# Patient Record
Sex: Female | Born: 1960 | Race: White | Hispanic: No | Marital: Married | Smoking: Current every day smoker
Health system: Southern US, Community
[De-identification: ages and names within clinical notes are randomized; demographics above are authoritative.]

## PROBLEM LIST (undated history)

## (undated) DIAGNOSIS — Z87442 Personal history of urinary calculi: Secondary | ICD-10-CM

## (undated) DIAGNOSIS — G4730 Sleep apnea, unspecified: Secondary | ICD-10-CM

## (undated) DIAGNOSIS — F419 Anxiety disorder, unspecified: Secondary | ICD-10-CM

## (undated) DIAGNOSIS — F329 Major depressive disorder, single episode, unspecified: Secondary | ICD-10-CM

## (undated) DIAGNOSIS — M1990 Unspecified osteoarthritis, unspecified site: Secondary | ICD-10-CM

## (undated) DIAGNOSIS — M3500 Sicca syndrome, unspecified: Secondary | ICD-10-CM

## (undated) DIAGNOSIS — I10 Essential (primary) hypertension: Secondary | ICD-10-CM

## (undated) DIAGNOSIS — F32A Depression, unspecified: Secondary | ICD-10-CM

## (undated) DIAGNOSIS — Z9289 Personal history of other medical treatment: Secondary | ICD-10-CM

## (undated) DIAGNOSIS — G8929 Other chronic pain: Secondary | ICD-10-CM

## (undated) HISTORY — PX: CHOLECYSTECTOMY: SHX55

## (undated) HISTORY — PX: ABDOMINAL HYSTERECTOMY: SHX81

---

## 2008-10-14 DIAGNOSIS — Z9289 Personal history of other medical treatment: Secondary | ICD-10-CM

## 2008-10-14 HISTORY — DX: Personal history of other medical treatment: Z92.89

## 2011-11-25 ENCOUNTER — Other Ambulatory Visit: Payer: Self-pay | Admitting: Neurosurgery

## 2011-11-25 ENCOUNTER — Other Ambulatory Visit (HOSPITAL_COMMUNITY): Payer: Self-pay | Admitting: Neurosurgery

## 2011-11-25 DIAGNOSIS — M545 Low back pain: Secondary | ICD-10-CM

## 2011-11-25 DIAGNOSIS — M542 Cervicalgia: Secondary | ICD-10-CM

## 2011-11-27 ENCOUNTER — Other Ambulatory Visit: Payer: Self-pay | Admitting: Neurosurgery

## 2011-12-27 ENCOUNTER — Other Ambulatory Visit (HOSPITAL_COMMUNITY): Payer: Self-pay

## 2012-01-07 ENCOUNTER — Inpatient Hospital Stay (HOSPITAL_COMMUNITY): Admission: RE | Admit: 2012-01-07 | Payer: Self-pay | Source: Ambulatory Visit

## 2012-01-07 ENCOUNTER — Other Ambulatory Visit (HOSPITAL_COMMUNITY): Payer: Self-pay

## 2013-10-14 DIAGNOSIS — G4730 Sleep apnea, unspecified: Secondary | ICD-10-CM

## 2013-10-14 HISTORY — DX: Sleep apnea, unspecified: G47.30

## 2017-06-25 ENCOUNTER — Encounter (HOSPITAL_BASED_OUTPATIENT_CLINIC_OR_DEPARTMENT_OTHER): Payer: Self-pay | Admitting: Emergency Medicine

## 2017-06-25 ENCOUNTER — Emergency Department (HOSPITAL_COMMUNITY): Payer: Medicaid Other

## 2017-06-25 ENCOUNTER — Emergency Department (HOSPITAL_BASED_OUTPATIENT_CLINIC_OR_DEPARTMENT_OTHER)
Admission: EM | Admit: 2017-06-25 | Discharge: 2017-06-26 | Disposition: A | Discharge status: Home / Self Care | Payer: Medicaid Other | Attending: Emergency Medicine | Admitting: Emergency Medicine

## 2017-06-25 DIAGNOSIS — F17200 Nicotine dependence, unspecified, uncomplicated: Secondary | ICD-10-CM | POA: Diagnosis not present

## 2017-06-25 DIAGNOSIS — I10 Essential (primary) hypertension: Secondary | ICD-10-CM | POA: Insufficient documentation

## 2017-06-25 DIAGNOSIS — R29898 Other symptoms and signs involving the musculoskeletal system: Secondary | ICD-10-CM

## 2017-06-25 DIAGNOSIS — R531 Weakness: Secondary | ICD-10-CM | POA: Insufficient documentation

## 2017-06-25 DIAGNOSIS — M542 Cervicalgia: Secondary | ICD-10-CM | POA: Diagnosis not present

## 2017-06-25 DIAGNOSIS — M545 Low back pain: Secondary | ICD-10-CM

## 2017-06-25 DIAGNOSIS — Z79899 Other long term (current) drug therapy: Secondary | ICD-10-CM | POA: Diagnosis not present

## 2017-06-25 HISTORY — DX: Sjogren syndrome, unspecified: M35.00

## 2017-06-25 HISTORY — DX: Essential (primary) hypertension: I10

## 2017-06-25 HISTORY — DX: Other chronic pain: G89.29

## 2017-06-25 HISTORY — DX: Unspecified osteoarthritis, unspecified site: M19.90

## 2017-06-25 LAB — BASIC METABOLIC PANEL
Anion gap: 8 (ref 5–15)
BUN: 11 mg/dL (ref 6–20)
CHLORIDE: 105 mmol/L (ref 101–111)
CO2: 26 mmol/L (ref 22–32)
Calcium: 9.2 mg/dL (ref 8.9–10.3)
Creatinine, Ser: 0.71 mg/dL (ref 0.44–1.00)
GFR calc Af Amer: >60 mL/min (ref >60)
GLUCOSE: 107 mg/dL — AB (ref 65–99)
POTASSIUM: 3.5 mmol/L (ref 3.5–5.1)
Sodium: 139 mmol/L (ref 135–145)

## 2017-06-25 LAB — CBC WITH DIFFERENTIAL/PLATELET
Basophils Absolute: 0.1 10*3/uL (ref 0.0–0.1)
Basophils Relative: 1 %
EOS PCT: 2 %
Eosinophils Absolute: 0.2 10*3/uL (ref 0.0–0.7)
HCT: 41.5 % (ref 36.0–46.0)
Hemoglobin: 14.2 g/dL (ref 12.0–15.0)
LYMPHS ABS: 3.2 10*3/uL (ref 0.7–4.0)
LYMPHS PCT: 28 %
MCH: 29.3 pg (ref 26.0–34.0)
MCHC: 34.2 g/dL (ref 30.0–36.0)
MCV: 85.6 fL (ref 78.0–100.0)
MONO ABS: 0.9 10*3/uL (ref 0.1–1.0)
Monocytes Relative: 7 %
Neutro Abs: 7.4 10*3/uL (ref 1.7–7.7)
Neutrophils Relative %: 62 %
PLATELETS: 387 10*3/uL (ref 150–400)
RBC: 4.85 MIL/uL (ref 3.87–5.11)
RDW: 12.9 % (ref 11.5–15.5)
WBC: 11.7 10*3/uL — ABNORMAL HIGH (ref 4.0–10.5)

## 2017-06-25 LAB — CK: Total CK: 51 U/L (ref 38–234)

## 2017-06-25 MED ORDER — KETOROLAC TROMETHAMINE 30 MG/ML IJ SOLN
15.0000 mg | Freq: Once | INTRAMUSCULAR | Status: AC
  Administered 2017-06-25: 15 mg via INTRAVENOUS
  Filled 2017-06-25: qty 1

## 2017-06-25 MED ORDER — LORAZEPAM 2 MG/ML IJ SOLN
1.0000 mg | Freq: Once | INTRAMUSCULAR | Status: AC
  Administered 2017-06-25: 1 mg via INTRAVENOUS
  Filled 2017-06-25: qty 1

## 2017-06-25 MED ORDER — HYDROMORPHONE HCL 1 MG/ML IJ SOLN
1.0000 mg | Freq: Once | INTRAMUSCULAR | Status: AC
  Administered 2017-06-25: 1 mg via INTRAVENOUS
  Filled 2017-06-25: qty 1

## 2017-06-25 MED ORDER — SODIUM CHLORIDE 0.9 % IV BOLUS (SEPSIS)
1000.0000 mL | Freq: Once | INTRAVENOUS | Status: AC
  Administered 2017-06-25: 1000 mL via INTRAVENOUS

## 2017-06-25 NOTE — ED Notes (Signed)
Spoke to Dr.Kohut who accepted pt; MD to place orders for MRI in waiting room.

## 2017-06-25 NOTE — ED Notes (Signed)
Pt requesting pain medicine, EDP aware 

## 2017-06-25 NOTE — ED Notes (Signed)
Pt transported to MRI 

## 2017-06-25 NOTE — ED Provider Notes (Signed)
[facility]   CSN: [id number] Arrival date & time: 06/25/17  1557     History   Chief Complaint Chief Complaint  Patient presents with  . Neck Pain  . Leg Pain    HPI Doris Padilla is a 56 y.o. female.  HPI  56 year old female with a history of rheumatoid arthritis and Sjogren's disease presents with bilateral thigh pain and weakness. She states this started today. She has a history of chronic pain, mostly in her neck but also her low back. The neck pain is worse. This is been going on for several months she's been trying to get an MRI through both an orthopedist and now a neurosurgeon since May but has been unsuccessful. She has been told she has significant arthritis in her neck and back. Her neck always hurts worse than her back. She occasionally will have arm weakness and numbness, this has been going on for months. However over the last 24 hours or less, she's been having bilateral thigh weakness and pain. Is painful to ambulate. It does not feel like this pain is coming from her back or radiating from her back. It feels like her entire thigh is sore and heavy like lead. No numbness. No bowel or bladder incontinence. No fevers or weight loss. Has taken 2 Advil and her oxycodone without relief.   Past Medical History:  Diagnosis Date  . Arthritis   . Hypertension   . Sjogren's disease (HCC)     There are no active problems to display for this patient.   Past Surgical History:  Procedure Laterality Date  . ABDOMINAL HYSTERECTOMY    . CESAREAN SECTION     x3  . CHOLECYSTECTOMY      OB History    No data available       Home Medications    Prior to Admission medications   Medication Sig Start Date End Date Taking? Authorizing Provider  amLODipine (NORVASC) 10 MG tablet Take 10 mg by mouth daily.   Yes [provider]  clonazePAM (KLONOPIN) 1 MG tablet Take 1 mg by mouth 2 (two) times daily as needed for anxiety.   Yes [provider]  cycloSPORINE (RESTASIS) 0.05 % ophthalmic emulsion Place 1 drop into both eyes 2 (two) times daily.   Yes [provider]  naproxen sodium (ANAPROX) 220 MG tablet Take 220 mg by mouth 2 (two) times daily with a meal.   Yes [provider]  oxyCODONE (OXY IR/ROXICODONE) 5 MG immediate release tablet Take 10 mg by mouth every 4 (four) hours as needed for severe pain.   Yes [provider]  predniSONE (DELTASONE) 5 MG tablet Take 5 mg by mouth daily with breakfast.   Yes [provider]  sertraline (ZOLOFT) 50 MG tablet Take 50 mg by mouth at bedtime.   Yes [provider]    Family History No family history on file.  Social History Social History  Substance Use Topics  . Smoking status: Light Tobacco Smoker  . Smokeless tobacco: Never Used  . Alcohol use No     Allergies   Patient has no known allergies.   Review of Systems Review of Systems  Constitutional: Negative for fever.  Gastrointestinal: Negative for abdominal pain.  Genitourinary: Negative for dysuria.  Musculoskeletal: Positive for back pain, myalgias and neck pain.  Neurological: Positive for weakness. Negative for numbness.  All other systems reviewed and are negative.    Physical Exam Updated Vital Signs  BP 136/83 (BP Location: Right Arm)   Pulse 93   Temp 98 F (36.7 C) (Oral)   Resp 18   Ht 5\' 2"  (1.575 m)   Wt 71.7 kg (158 lb)   SpO2 96%   BMI 28.90 kg/m   Physical Exam  Constitutional: She is oriented to person, place, and time. She appears well-developed and well-nourished. No distress.  HENT:  Head: Normocephalic and atraumatic.  Right Ear: External ear normal.  Left Ear: External ear normal.  Nose: Nose normal.  Eyes: Right eye exhibits no discharge. Left eye exhibits no discharge.  Cardiovascular: Normal rate, regular rhythm and normal heart sounds.   Pulses:      Radial pulses are 2+ on the right side, and 2+ on the left side.         Dorsalis pedis pulses are 2+ on the right side, and 2+ on the left side.  Pulmonary/Chest: Effort normal and breath sounds normal.  Abdominal: Soft. There is no tenderness.  Musculoskeletal:       Right hip: She exhibits normal range of motion and no tenderness.       Left hip: She exhibits normal range of motion and no tenderness.       Cervical back: She exhibits no tenderness.       Thoracic back: She exhibits no tenderness.       Lumbar back: She exhibits no tenderness.       Right upper leg: She exhibits no tenderness and no swelling.       Left upper leg: She exhibits no tenderness and no swelling.  Neurological: She is alert and oriented to person, place, and time.  Reflex Scores:      Patellar reflexes are 2+ on the right side and 2+ on the left side.      Achilles reflexes are 2+ on the right side and 2+ on the left side. Very limited ability to lift legs off bed in straight leg raise bilaterally. Some decreased sensation in thighs compared to lower legs which feel normal. Unclear if there is decreased effort due to pain or true neuro weakness. Is able to walk although slowly and with pain.  Skin: Skin is warm and dry. She is not diaphoretic.  Nursing note and vitals reviewed.    ED Treatments / Results  Labs (all labs ordered are listed, but only abnormal results are displayed) Labs Reviewed  BASIC METABOLIC PANEL - Abnormal; Notable for the following:       Result Value   Glucose, Bld 107 (*)    All other components within normal limits  CBC WITH DIFFERENTIAL/PLATELET - Abnormal; Notable for the following:    WBC 11.7 (*)    All other components within normal limits  CK    EKG  EKG Interpretation None       Radiology No results found.  Procedures Procedures (including critical care time)  Medications Ordered in ED Medications  HYDROmorphone (DILAUDID) injection 1 mg (not administered)  sodium chloride 0.9 % bolus 1,000 mL (1,000 mLs Intravenous New  Bag/Given 06/25/17 1724)  ketorolac (TORADOL) 30 MG/ML injection 15 mg (15 mg Intravenous Given 06/25/17 1724)     Initial Impression / Assessment and Plan / ED Course  I have reviewed the triage vital signs and the nursing notes.  Pertinent labs & imaging results that were available during my care of the patient were reviewed by me and considered in my medical decision making (see chart for details).  Labwork shows no clear etiology for why she would have all of a sudden muscular weakness. There is no focal tenderness in her thighs to suggest needing an x-ray. No trauma. At this point, given the weakness in the proximal lower legs, I think she needs MRI to help rule out acute spinal cord compression. We do not have this available at this facility. She agrees to transfer to the [facility] for MRI. She wants to go by private vehicle with her friend. Givens while striving, she will be given further IV pain control. Discussed with EDP, Dr. Juleen China, who accepts in transfer.  Final Clinical Impressions(s) / ED Diagnoses   Final diagnoses:  Bilateral leg weakness    New Prescriptions New Prescriptions   No medications on file     Pricilla Loveless, MD 06/25/17 1821

## 2017-06-25 NOTE — ED Triage Notes (Signed)
Leg and Neck pain increased last night. Hx of RA and other degenerative issues. Denies recent falls.

## 2017-06-25 NOTE — ED Notes (Signed)
Pt taken to car in [facility] and is on her way to [facility] ED with a friend who is driving her.

## 2017-06-25 NOTE — ED Provider Notes (Addendum)
Notified that arrived to [facility] from [facility] by PV. In waiting. MRI ordered.    Raeford Razor, MD 06/25/17 2044  MRI as below. Pt and SE updated. PRN pain meds. Outpt FU.   Mr Lumbar Spine Wo Contrast  Result Date: 06/26/2017 CLINICAL DATA:  56 y/o F; lower back pain radiating down both thighs. EXAM: MRI LUMBAR SPINE WITHOUT CONTRAST TECHNIQUE: Multiplanar, multisequence MR imaging of the lumbar spine was performed. No intravenous contrast was administered. COMPARISON:  01/26/2015 lumbar spine MRI. FINDINGS: Segmentation:  Standard. Alignment:  Physiologic. Vertebrae:  No fracture, evidence of discitis, or bone lesion. Conus medullaris: Extends to the L1-2 level and appears normal. Stable S2 level Tarlov cysts. Paraspinal and other soft tissues: Negative. Disc levels: L1-2: No significant disc displacement, foraminal stenosis, or canal stenosis. L2-3: No significant disc displacement, foraminal stenosis, or canal stenosis. L3-4: Stable minimal disc bulge. No significant foraminal or canal stenosis. L4-5: Stable small disc bulge eccentric to the left and mild facet hypertrophy. Mild left foraminal stenosis. No significant canal stenosis. L5-S1: Stable slightly left central disc protrusion and mild facet hypertrophy. Stable mild bilateral foraminal and lateral recess stenosis. No significant canal stenosis. IMPRESSION: 1. No acute osseous abnormality. 2. Stable lumbar degenerative changes greatest at the L5-S1 level where a slightly left central disc protrusion and facet hypertrophy results in mild bilateral foraminal and lateral recess stenosis. 3. No significant canal stenosis. Electronically Signed   By: Mitzi Hansen M.D.   On: 06/26/2017 00:33      Raeford Razor, MD 06/26/17 [phone]

## 2017-06-26 MED ORDER — OXYCODONE-ACETAMINOPHEN 5-325 MG PO TABS: 1.0000 | ORAL_TABLET | ORAL | 0 refills | Status: DC | PRN

## 2017-06-26 MED ORDER — DIAZEPAM 5 MG PO TABS: 5.0000 mg | ORAL_TABLET | Freq: Three times a day (TID) | ORAL | 0 refills | Status: DC | PRN

## 2017-07-21 ENCOUNTER — Encounter (HOSPITAL_BASED_OUTPATIENT_CLINIC_OR_DEPARTMENT_OTHER): Payer: Self-pay | Admitting: *Deleted

## 2017-07-21 ENCOUNTER — Emergency Department (HOSPITAL_BASED_OUTPATIENT_CLINIC_OR_DEPARTMENT_OTHER): Payer: Medicaid Other

## 2017-07-21 ENCOUNTER — Emergency Department (HOSPITAL_BASED_OUTPATIENT_CLINIC_OR_DEPARTMENT_OTHER)
Admission: EM | Admit: 2017-07-21 | Discharge: 2017-07-21 | Disposition: A | Discharge status: Home / Self Care | Payer: Medicaid Other | Attending: Emergency Medicine | Admitting: Emergency Medicine

## 2017-07-21 DIAGNOSIS — Z79899 Other long term (current) drug therapy: Secondary | ICD-10-CM | POA: Insufficient documentation

## 2017-07-21 DIAGNOSIS — G43809 Other migraine, not intractable, without status migrainosus: Secondary | ICD-10-CM | POA: Diagnosis not present

## 2017-07-21 DIAGNOSIS — R197 Diarrhea, unspecified: Secondary | ICD-10-CM | POA: Insufficient documentation

## 2017-07-21 DIAGNOSIS — F17200 Nicotine dependence, unspecified, uncomplicated: Secondary | ICD-10-CM | POA: Insufficient documentation

## 2017-07-21 DIAGNOSIS — R51 Headache: Secondary | ICD-10-CM | POA: Diagnosis present

## 2017-07-21 DIAGNOSIS — I10 Essential (primary) hypertension: Secondary | ICD-10-CM | POA: Insufficient documentation

## 2017-07-21 LAB — CBC
HCT: 43.2 % (ref 36.0–46.0)
Hemoglobin: 14.8 g/dL (ref 12.0–15.0)
MCH: 28.9 pg (ref 26.0–34.0)
MCHC: 34.3 g/dL (ref 30.0–36.0)
MCV: 84.4 fL (ref 78.0–100.0)
PLATELETS: 438 10*3/uL — AB (ref 150–400)
RBC: 5.12 MIL/uL — ABNORMAL HIGH (ref 3.87–5.11)
RDW: 12.9 % (ref 11.5–15.5)
WBC: 12.3 10*3/uL — AB (ref 4.0–10.5)

## 2017-07-21 LAB — URINALYSIS, ROUTINE W REFLEX MICROSCOPIC
Bilirubin Urine: NEGATIVE
Glucose, UA: NEGATIVE mg/dL
Hgb urine dipstick: NEGATIVE
Ketones, ur: NEGATIVE mg/dL
Leukocytes, UA: NEGATIVE
Nitrite: NEGATIVE
Protein, ur: NEGATIVE mg/dL
Specific Gravity, Urine: <1.005 — ABNORMAL LOW (ref 1.005–1.030)
pH: 6.5 (ref 5.0–8.0)

## 2017-07-21 LAB — BASIC METABOLIC PANEL WITH GFR
Anion gap: 7 (ref 5–15)
BUN: 8 mg/dL (ref 6–20)
CO2: 24 mmol/L (ref 22–32)
Calcium: 10 mg/dL (ref 8.9–10.3)
Chloride: 106 mmol/L (ref 101–111)
Creatinine, Ser: 0.61 mg/dL (ref 0.44–1.00)
GFR calc Af Amer: >60 mL/min
GFR calc non Af Amer: >60 mL/min
Glucose, Bld: 110 mg/dL — ABNORMAL HIGH (ref 65–99)
Potassium: 3.3 mmol/L — ABNORMAL LOW (ref 3.5–5.1)
Sodium: 137 mmol/L (ref 135–145)

## 2017-07-21 MED ORDER — DIPHENHYDRAMINE HCL 50 MG/ML IJ SOLN
12.5000 mg | Freq: Once | INTRAMUSCULAR | Status: AC
  Administered 2017-07-21: 12.5 mg via INTRAVENOUS
  Filled 2017-07-21: qty 1

## 2017-07-21 MED ORDER — PROCHLORPERAZINE EDISYLATE 5 MG/ML IJ SOLN
10.0000 mg | Freq: Once | INTRAMUSCULAR | Status: AC
  Administered 2017-07-21: 10 mg via INTRAVENOUS
  Filled 2017-07-21: qty 2

## 2017-07-21 MED ORDER — DEXAMETHASONE SODIUM PHOSPHATE 10 MG/ML IJ SOLN
10.0000 mg | Freq: Once | INTRAMUSCULAR | Status: AC
  Administered 2017-07-21: 10 mg via INTRAVENOUS
  Filled 2017-07-21: qty 1

## 2017-07-21 MED ORDER — SODIUM CHLORIDE 0.9 % IV BOLUS (SEPSIS)
1000.0000 mL | Freq: Once | INTRAVENOUS | Status: AC
  Administered 2017-07-21: 1000 mL via INTRAVENOUS

## 2017-07-21 NOTE — ED Notes (Signed)
Patient transported to CT 

## 2017-07-21 NOTE — Discharge Instructions (Signed)
Please read attached information regarding your condition. Continue home medications as previously prescribed. Follow-up with your PCP and spine specialist for further evaluation. Take Tylenol as needed for headache. Return to ED for severe or worsening headache, vision changes, falls, head injuries, vomiting.

## 2017-07-21 NOTE — ED Provider Notes (Signed)
[facility]   CSN: [id number] Arrival date & time: 07/21/17  1301     History   Chief Complaint Chief Complaint  Patient presents with  . Hypertension  . Headache    HPI Doris Padilla is a 56 y.o. female, with a past medical history of Sjogren's disease, arthritis, hypertension, presents to ED for evaluation of headache for the past day. She also reports high blood pressure when she checked it this morning. She did take 2 doses of her home amlodipine. She has been having a bilateral parietal headache since yesterday. States that the pain radiates to her years. She has not tried any medications to help with headache. She also reports associated photophobia and phonophobia. She denies any previous history of similar symptoms. She's also been experiencing intermittent nausea and several episodes of diarrhea. She was taking care of her mother who had similar symptoms last week. She denies any vision changes, head injuries, falls, abdominal pain, urinary symptoms, fever, neck pain, rashes, chest pain, shortness of breath.  HPI  Past Medical History:  Diagnosis Date  . Arthritis    RA  . Chronic pain   . Hypertension   . Sjogren's disease (HCC)     There are no active problems to display for this patient.   Past Surgical History:  Procedure Laterality Date  . ABDOMINAL HYSTERECTOMY    . CESAREAN SECTION     x3  . CHOLECYSTECTOMY      OB History    No data available       Home Medications    Prior to Admission medications   Medication Sig Start Date End Date Taking? Authorizing Provider  amLODipine (NORVASC) 10 MG tablet Take 10 mg by mouth daily.    [provider]  clonazePAM (KLONOPIN) 1 MG tablet Take 1 mg by mouth 2 (two) times daily as needed for anxiety.    [provider]  cycloSPORINE (RESTASIS) 0.05 % ophthalmic emulsion Place 1 drop into both eyes 2 (two) times daily.    [provider]  diazepam (VALIUM)  5 MG tablet Take 1 tablet (5 mg total) by mouth every 8 (eight) hours as needed for anxiety. 06/26/17   Raeford Razor, MD  naproxen sodium (ANAPROX) 220 MG tablet Take 220 mg by mouth 2 (two) times daily with a meal.    [provider]  oxyCODONE (OXY IR/ROXICODONE) 5 MG immediate release tablet Take 10 mg by mouth every 4 (four) hours as needed for severe pain.    [provider]  oxyCODONE-acetaminophen (PERCOCET/ROXICET) 5-325 MG tablet Take 1-2 tablets by mouth every 4 (four) hours as needed for severe pain. 06/26/17   Raeford Razor, MD  predniSONE (DELTASONE) 5 MG tablet Take 5 mg by mouth daily with breakfast.    [provider]  sertraline (ZOLOFT) 50 MG tablet Take 50 mg by mouth at bedtime.    [provider]    Family History No family history on file.  Social History Social History  Substance Use Topics  . Smoking status: Light Tobacco Smoker  . Smokeless tobacco: Never Used  . Alcohol use No     Allergies   Patient has no known allergies.   Review of Systems Review of Systems  Constitutional: Negative for appetite change, chills and fever.  HENT: Negative for ear pain, rhinorrhea, sneezing and sore throat.   Eyes: Positive for photophobia. Negative for visual disturbance.  Respiratory: Negative for cough, chest tightness, shortness of breath and  wheezing.   Cardiovascular: Negative for chest pain and palpitations.  Gastrointestinal: Positive for diarrhea and nausea. Negative for abdominal pain, blood in stool, constipation and vomiting.  Genitourinary: Negative for dysuria, hematuria and urgency.  Musculoskeletal: Positive for myalgias.  Skin: Negative for rash.  Neurological: Positive for light-headedness and headaches. Negative for dizziness and weakness.     Physical Exam Updated Vital Signs BP (!) 136/100   Pulse 95   Temp 98.8 F (37.1 C) (Oral)   Resp 10   Ht 5\' 2"  (1.575 m)   Wt 71.7 kg (158 lb)   SpO2 98%   BMI  28.90 kg/m   Physical Exam  Constitutional: She is oriented to person, place, and time. She appears well-developed and well-nourished. No distress.  Nontoxic appearing and in no acute distress. Patient does appear uncomfortable and has lights off in room.  HENT:  Head: Normocephalic and atraumatic.  Nose: Nose normal.  Eyes: Pupils are equal, round, and reactive to light. Conjunctivae and EOM are normal. Right eye exhibits no discharge. Left eye exhibits no discharge. No scleral icterus.  Neck: Normal range of motion. Neck supple.  Cardiovascular: Normal rate, regular rhythm, normal heart sounds and intact distal pulses.  Exam reveals no gallop and no friction rub.   No murmur heard. Pulmonary/Chest: Effort normal and breath sounds normal. No respiratory distress.  Abdominal: Soft. Bowel sounds are normal. She exhibits no distension. There is no tenderness. There is no guarding.  Musculoskeletal: Normal range of motion. She exhibits no edema.  Neurological: She is alert and oriented to person, place, and time. No cranial nerve deficit or sensory deficit. She exhibits normal muscle tone. Coordination normal.  Pupils reactive. No facial asymmetry noted. Cranial nerves appear grossly intact. Sensation intact to light touch on face, BUE and BLE. Strength 5/5 in BUE and BLE. Normal finger to nose coordination bilaterally.  Skin: Skin is warm and dry. No rash noted. She is not diaphoretic.  No temporal rash or tenderness to palpation of the temporal area.  Psychiatric: She has a normal mood and affect.  Nursing note and vitals reviewed.    ED Treatments / Results  Labs (all labs ordered are listed, but only abnormal results are displayed) Labs Reviewed  BASIC METABOLIC PANEL - Abnormal; Notable for the following:       Result Value   Potassium 3.3 (*)    Glucose, Bld 110 (*)    All other components within normal limits  CBC - Abnormal; Notable for the following:    WBC 12.3 (*)    RBC  5.12 (*)    Platelets 438 (*)    All other components within normal limits  URINALYSIS, ROUTINE W REFLEX MICROSCOPIC - Abnormal; Notable for the following:    Specific Gravity, Urine <1.005 (*)    All other components within normal limits    EKG  EKG Interpretation  Date/Time:  Monday July 21 2017 14:07:13 EDT Ventricular Rate:  97 PR Interval:    QRS Duration: 89 QT Interval:  353 QTC Calculation: 449 R Axis:   75 Text Interpretation:  Sinus rhythm since last tracing no significant change Confirmed by Rolan Bucco [phone]) on 07/21/2017 2:16:37 PM       Radiology Ct Head Wo Contrast  Result Date: 07/21/2017 CLINICAL DATA:  Headache and hypertension. EXAM: CT HEAD WITHOUT CONTRAST TECHNIQUE: Contiguous axial images were obtained from the base of the skull through the vertex without intravenous contrast. COMPARISON:  None. FINDINGS: Brain: No evidence of  acute infarction, hemorrhage, hydrocephalus, extra-axial collection or mass lesion/mass effect. Vascular: Mild calcific atherosclerosis at the skullbase. Skull: Normal. Negative for fracture or focal lesion. Sinuses/Orbits: No acute finding. Other: None. IMPRESSION: No acute intracranial abnormality. 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 Electronically Signed   By: Ted Mcalpine M.D.   On: 07/21/2017 15:40    Procedures Procedures (including critical care time)  Medications Ordered in ED Medications  dexamethasone (DECADRON) injection 10 mg (not administered)  sodium chloride 0.9 % bolus 1,000 mL (1,000 mLs Intravenous New Bag/Given 07/21/17 1442)  prochlorperazine (COMPAZINE) injection 10 mg (10 mg Intravenous Given 07/21/17 1442)  diphenhydrAMINE (BENADRYL) injection 12.5 mg (12.5 mg Intravenous Given 07/21/17 1442)     Initial Impression / Assessment and Plan / ED Course  I have reviewed the triage vital signs and the nursing notes.  Pertinent labs &  imaging results that were available during my care of the patient were reviewed by me and considered in my medical decision making (see chart for details).     Patient presents to ED for evaluation of headache since yesterday. She has associated photophobia and phonophobia. She does have a history of hypertension and states that her blood pressure was elevated this morning. She took 2 doses of her usual amlodipine dose. She has not tried any medications to help with headache. She denies any injuries, falls, numbness in legs, vision changes. She's been expressing intermittent nausea and several episodes of diarrhea after being exposed to sick contacts with similar symptoms. On physical exam patient is nontoxic-appearing and in no acute distress. She has no deficits on neurological exam. Her cranial nerves appear grossly intact. She does not appear dehydrated but does appear uncomfortable. There are no signs of head injury, temporal tenderness to palpation or rashes. She is afebrile with no history of fever. Lab work including BMP, urinalysis unremarkable. CBC shows mild leukocytosis at 12.3. Head CT returned as negative for acute abnormality. I suspect that her leukocytosis is reactive to the symptoms she is having. Patient given fluids, Compazine and Benadryl here in the ED which much improvement in her symptoms. Patient given Decadron for further management and prevention of rebound headache. Blood pressure improved here in the ED. I suspect that her symptoms are due to a migraine type headache rather than acute intracranial abnormality or infectious cause or temporal arteritis.  Advised to follow-up with her PCP in any specialist for further evaluation. Patient appears stable for discharge at this time. Strict return precautions given.  Final Clinical Impressions(s) / ED Diagnoses   Final diagnoses:  Other migraine without status migrainosus, not intractable    New Prescriptions New Prescriptions    No medications on file     Dietrich Pates, PA-C 07/21/17 1637    Rolan Bucco, MD 07/25/17 [phone]

## 2017-07-21 NOTE — ED Triage Notes (Signed)
Headache and elevated BP at home this am. States she had an MRI of her neck 3 weeks ago but does not have the results.

## 2017-07-28 ENCOUNTER — Other Ambulatory Visit: Payer: Self-pay | Admitting: Neurosurgery

## 2017-08-14 NOTE — Pre-Procedure Instructions (Signed)
Doris Padilla  08/14/2017      [id number] - [place], [place] - [address] [place] [address] Phone: [phone] Fax: [phone]    Your procedure is scheduled on Nov 7  Report to [facility] Admitting at 1000 A.M.  Call this number if you have problems the morning of surgery:  [phone]   Remember:  Do not eat food or drink liquids after midnight.  Take these medicines the morning of surgery with A SIP OF WATER Amlodipine (Norvasc), Clonazepam (Klonopin) if needed, Restasis eye drops, Fluoxetine (Prozac), Flonase nasal spray if needed, orphenadrine (Norflex), Oxycodone HCL if needed  Stop taking aspirin as directed by your Dr.  Stop taking BC's, Goody's, Herbal medications, Fish Oil, Ibuprofen, Advil, Motrin, Aleve, Vitamins    Do not wear jewelry, make-up or nail polish.  Do not wear lotions, powders, or perfumes, or deoderant.  Do not shave 48 hours prior to surgery.  Men may shave face and neck.  Do not bring valuables to the hospital.  [facility] is not responsible for any belongings or valuables.  Contacts, dentures or bridgework may not be worn into surgery.  Leave your suitcase in the car.  After surgery it may be brought to your room.  For patients admitted to the hospital, discharge time will be determined by your treatment team.  Patients discharged the day of surgery will not be allowed to drive home.   Special instructions:  [name] - Preparing for Surgery  Before surgery, you can play an important role.  Because skin is not sterile, your skin needs to be as free of germs as possible.  You can reduce the number of germs on you skin by washing with CHG (chlorahexidine gluconate) soap before surgery.  CHG is an antiseptic cleaner which kills germs and bonds with the skin to continue killing germs even after washing.  Please DO NOT use if you have an allergy to CHG or antibacterial soaps.  If your skin becomes  reddened/irritated stop using the CHG and inform your nurse when you arrive at Short Stay.  Do not shave (including legs and underarms) for at least 48 hours prior to the first CHG shower.  You may shave your face.  Please follow these instructions carefully:   1.  Shower with CHG Soap the night before surgery and the   morning of Surgery.  2.  If you choose to wash your hair, wash your hair first as usual with your   normal shampoo.  3.  After you shampoo, rinse your hair and body thoroughly to remove the  Shampoo.  4.  Use CHG as you would any other liquid soap.  You can apply chg directly to the skin and wash gently with scrungie or a clean washcloth.  5.  Apply the CHG Soap to your body ONLY FROM THE NECK DOWN.  Do not use on open wounds or open sores.  Avoid contact with your eyes,  ears, mouth and genitals (private parts).  Wash genitals (private parts)       with your normal soap.  6.  Wash thoroughly, paying special attention to the area where your surgery   will be performed.  7.  Thoroughly rinse your body with warm water from the neck down.  8.  DO NOT shower/wash with your normal soap after using and rinsing off   the CHG Soap.  9.  Pat yourself dry with a clean  towel.            10.  Wear clean pajamas.            11.  Place clean sheets on your bed the night of your first shower and do not sleep with pets.  Day of Surgery  Do not apply any lotions/deoderants the morning of surgery.  Please wear clean clothes to the hospital/surgery center.     Please read over the following fact sheets that you were given. Pain Booklet, Coughing and Deep Breathing, MRSA Information and Surgical Site Infection Prevention

## 2017-08-15 ENCOUNTER — Encounter (HOSPITAL_COMMUNITY): Payer: Self-pay

## 2017-08-15 ENCOUNTER — Encounter (HOSPITAL_COMMUNITY)
Admission: RE | Admit: 2017-08-15 | Discharge: 2017-08-15 | Disposition: A | Discharge status: Home / Self Care | Payer: Medicaid Other | Source: Ambulatory Visit | Attending: Neurosurgery | Admitting: Neurosurgery

## 2017-08-15 DIAGNOSIS — Z01818 Encounter for other preprocedural examination: Secondary | ICD-10-CM | POA: Diagnosis not present

## 2017-08-15 DIAGNOSIS — M4722 Other spondylosis with radiculopathy, cervical region: Secondary | ICD-10-CM | POA: Diagnosis not present

## 2017-08-15 HISTORY — DX: Personal history of urinary calculi: Z87.442

## 2017-08-15 HISTORY — DX: Anxiety disorder, unspecified: F41.9

## 2017-08-15 HISTORY — DX: Major depressive disorder, single episode, unspecified: F32.9

## 2017-08-15 HISTORY — DX: Personal history of other medical treatment: Z92.89

## 2017-08-15 HISTORY — DX: Sleep apnea, unspecified: G47.30

## 2017-08-15 HISTORY — DX: Depression, unspecified: F32.A

## 2017-08-15 LAB — CBC
HEMATOCRIT: 43.5 % (ref 36.0–46.0)
Hemoglobin: 15.2 g/dL — ABNORMAL HIGH (ref 12.0–15.0)
MCH: 30 pg (ref 26.0–34.0)
MCHC: 34.9 g/dL (ref 30.0–36.0)
MCV: 85.8 fL (ref 78.0–100.0)
PLATELETS: 438 10*3/uL — AB (ref 150–400)
RBC: 5.07 MIL/uL (ref 3.87–5.11)
RDW: 13 % (ref 11.5–15.5)
WBC: 11.4 10*3/uL — AB (ref 4.0–10.5)

## 2017-08-15 LAB — TYPE AND SCREEN
ABO/RH(D): O POS
Antibody Screen: NEGATIVE

## 2017-08-15 LAB — BASIC METABOLIC PANEL
Anion gap: 11 (ref 5–15)
BUN: 6 mg/dL (ref 6–20)
CHLORIDE: 103 mmol/L (ref 101–111)
CO2: 23 mmol/L (ref 22–32)
CREATININE: 0.65 mg/dL (ref 0.44–1.00)
Calcium: 9.9 mg/dL (ref 8.9–10.3)
GFR calc Af Amer: >60 mL/min (ref >60)
GFR calc non Af Amer: >60 mL/min (ref >60)
Glucose, Bld: 96 mg/dL (ref 65–99)
POTASSIUM: 3.6 mmol/L (ref 3.5–5.1)
Sodium: 137 mmol/L (ref 135–145)

## 2017-08-15 LAB — ABO/RH: ABO/RH(D): O POS

## 2017-08-15 LAB — SURGICAL PCR SCREEN
MRSA, PCR: NEGATIVE
Staphylococcus aureus: NEGATIVE

## 2017-08-15 NOTE — Progress Notes (Signed)
Pt. Not feeling well today, saw PCP- Whiten in [place] & she was given 3 day course of Zpak for swollen glands. On 08/12/2017 , she finished it & doesn't feel improved, has not called MD to f/u either. Pt. Denies chest concerns, denies fever, cough, etc., presenting with general malaise. Pt. Admits to stressful personal issues, has a a 56 y.o son on dialysis. She lives  With her daughter who has a 10 mth. Old, she also reports that she is separated from her husband but he will accompany her DOS.  Pt. Goes to a Pain med. Mgt office in [place].  Pt reports a stress test was done in [facility] hosp. Approx. 2-3 yrs. Ago, told that it was wnl.  Requested that record from [facility].

## 2017-08-18 NOTE — Progress Notes (Signed)
Anesthesia Chart Review:  Pt is a 56 year old female scheduled for C5-6, C6-7 ACDF on 08/20/2017 with Tressie Stalker, MD  - PCP is Selina Cooley, MD (notes in care everywhere) who last saw pt for URI 08/07/17 and prescribed zithromax 500mg  x 3 days 08/11/17.  Pt reported at pre-admission testing 08/15/17 she has finished zithromax but does not yet feel better. Denied fever, cough; c/o malaise. I notified Nikki in Dr. 13/2/18 office of pt reporting she does not feel well.  Pt was instructed by PAT RN to report this to PCP.   PMH includes:  HTN, OSA, Sjogren's disease. Current smoker. BMI 29  Medications include: Amlodipine, ASA 81 mg  BP 134/81   Pulse 76   Temp 36.6 C (Oral)   Resp 18   Ht 5\' 1"  (1.549 m)   Wt 154 lb 6 oz (70 kg)   SpO2 96%   BMI 29.17 kg/m   Preoperative labs reviewed.    EKG 07/21/17: sinus rhythm  Pt reports she had a stress test ~3 years ago that was normal at [facility].  Unable to locate records.   If no signs/sx of acute illness day of surgery, I anticipate pt can proceed as scheduled.   09/20/17, FNP-BC [facility]/Anesthesiology Phone: [phone] 08/18/2017 12:04 PM

## 2017-08-20 ENCOUNTER — Other Ambulatory Visit: Payer: Self-pay

## 2017-08-20 ENCOUNTER — Ambulatory Visit (HOSPITAL_COMMUNITY): Payer: Medicaid Other | Admitting: Emergency Medicine

## 2017-08-20 ENCOUNTER — Encounter (HOSPITAL_COMMUNITY): Payer: Self-pay | Admitting: *Deleted

## 2017-08-20 ENCOUNTER — Encounter (HOSPITAL_COMMUNITY)
Admission: RE | Disposition: A | Discharge status: Home / Self Care | Payer: Self-pay | Source: Ambulatory Visit | Attending: Neurosurgery

## 2017-08-20 ENCOUNTER — Ambulatory Visit (HOSPITAL_COMMUNITY): Payer: Medicaid Other

## 2017-08-20 ENCOUNTER — Ambulatory Visit (HOSPITAL_COMMUNITY): Payer: Medicaid Other | Admitting: Anesthesiology

## 2017-08-20 ENCOUNTER — Ambulatory Visit (HOSPITAL_COMMUNITY)
Admission: RE | Admit: 2017-08-20 | Discharge: 2017-08-21 | Disposition: A | Discharge status: Home / Self Care | Payer: Medicaid Other | Source: Ambulatory Visit | Attending: Neurosurgery | Admitting: Neurosurgery

## 2017-08-20 DIAGNOSIS — G8929 Other chronic pain: Secondary | ICD-10-CM | POA: Diagnosis not present

## 2017-08-20 DIAGNOSIS — Z888 Allergy status to other drugs, medicaments and biological substances status: Secondary | ICD-10-CM | POA: Diagnosis not present

## 2017-08-20 DIAGNOSIS — F17210 Nicotine dependence, cigarettes, uncomplicated: Secondary | ICD-10-CM | POA: Diagnosis not present

## 2017-08-20 DIAGNOSIS — Z79899 Other long term (current) drug therapy: Secondary | ICD-10-CM | POA: Diagnosis not present

## 2017-08-20 DIAGNOSIS — Z79891 Long term (current) use of opiate analgesic: Secondary | ICD-10-CM | POA: Insufficient documentation

## 2017-08-20 DIAGNOSIS — M50122 Cervical disc disorder at C5-C6 level with radiculopathy: Secondary | ICD-10-CM | POA: Diagnosis not present

## 2017-08-20 DIAGNOSIS — F329 Major depressive disorder, single episode, unspecified: Secondary | ICD-10-CM | POA: Diagnosis not present

## 2017-08-20 DIAGNOSIS — Z7982 Long term (current) use of aspirin: Secondary | ICD-10-CM | POA: Diagnosis not present

## 2017-08-20 DIAGNOSIS — I10 Essential (primary) hypertension: Secondary | ICD-10-CM | POA: Insufficient documentation

## 2017-08-20 DIAGNOSIS — M50123 Cervical disc disorder at C6-C7 level with radiculopathy: Secondary | ICD-10-CM | POA: Diagnosis not present

## 2017-08-20 DIAGNOSIS — M4802 Spinal stenosis, cervical region: Secondary | ICD-10-CM | POA: Insufficient documentation

## 2017-08-20 DIAGNOSIS — Z791 Long term (current) use of non-steroidal anti-inflammatories (NSAID): Secondary | ICD-10-CM | POA: Insufficient documentation

## 2017-08-20 DIAGNOSIS — M4722 Other spondylosis with radiculopathy, cervical region: Secondary | ICD-10-CM | POA: Diagnosis not present

## 2017-08-20 DIAGNOSIS — F419 Anxiety disorder, unspecified: Secondary | ICD-10-CM | POA: Diagnosis not present

## 2017-08-20 DIAGNOSIS — M069 Rheumatoid arthritis, unspecified: Secondary | ICD-10-CM | POA: Diagnosis not present

## 2017-08-20 DIAGNOSIS — G4730 Sleep apnea, unspecified: Secondary | ICD-10-CM | POA: Diagnosis not present

## 2017-08-20 DIAGNOSIS — Z419 Encounter for procedure for purposes other than remedying health state, unspecified: Secondary | ICD-10-CM

## 2017-08-20 HISTORY — PX: ANTERIOR CERVICAL DECOMP/DISCECTOMY FUSION: SHX1161

## 2017-08-20 SURGERY — ANTERIOR CERVICAL DECOMPRESSION/DISCECTOMY FUSION 2 LEVELS
Anesthesia: General

## 2017-08-20 MED ORDER — HEMOSTATIC AGENTS (NO CHARGE) OPTIME
TOPICAL | Status: DC | PRN
  Administered 2017-08-20: 1 via TOPICAL

## 2017-08-20 MED ORDER — SUGAMMADEX SODIUM 200 MG/2ML IV SOLN
INTRAVENOUS | Status: AC
  Filled 2017-08-20: qty 2

## 2017-08-20 MED ORDER — CEFAZOLIN SODIUM-DEXTROSE 2-4 GM/100ML-% IV SOLN
2.0000 g | INTRAVENOUS | Status: AC
  Administered 2017-08-20: 2 g via INTRAVENOUS
  Filled 2017-08-20: qty 100

## 2017-08-20 MED ORDER — FLUTICASONE PROPIONATE 50 MCG/ACT NA SUSP
1.0000 | Freq: Every day | NASAL | Status: DC
  Filled 2017-08-20: qty 16

## 2017-08-20 MED ORDER — 0.9 % SODIUM CHLORIDE (POUR BTL) OPTIME
TOPICAL | Status: DC | PRN
  Administered 2017-08-20: 1000 mL

## 2017-08-20 MED ORDER — SUGAMMADEX SODIUM 200 MG/2ML IV SOLN
INTRAVENOUS | Status: DC | PRN
Start: 2017-08-20 — End: 2017-08-20
  Administered 2017-08-20: 140 mg via INTRAVENOUS

## 2017-08-20 MED ORDER — ONDANSETRON HCL 4 MG PO TABS: 4.0000 mg | ORAL_TABLET | Freq: Four times a day (QID) | ORAL | Status: DC | PRN

## 2017-08-20 MED ORDER — METHOCARBAMOL 500 MG PO TABS
500.0000 mg | ORAL_TABLET | Freq: Four times a day (QID) | ORAL | Status: DC | PRN
  Administered 2017-08-20: 500 mg via ORAL

## 2017-08-20 MED ORDER — FENTANYL CITRATE (PF) 100 MCG/2ML IJ SOLN
INTRAMUSCULAR | Status: DC | PRN
  Administered 2017-08-20 (×2): 25 ug via INTRAVENOUS
  Administered 2017-08-20: 50 ug via INTRAVENOUS
  Administered 2017-08-20: 100 ug via INTRAVENOUS
  Administered 2017-08-20: 50 ug via INTRAVENOUS

## 2017-08-20 MED ORDER — DEXAMETHASONE SODIUM PHOSPHATE 10 MG/ML IJ SOLN
INTRAMUSCULAR | Status: DC | PRN
  Administered 2017-08-20: 10 mg via INTRAVENOUS

## 2017-08-20 MED ORDER — HYDROMORPHONE HCL 1 MG/ML IJ SOLN
INTRAMUSCULAR | Status: AC
  Filled 2017-08-20: qty 1

## 2017-08-20 MED ORDER — DIPHENHYDRAMINE HCL 50 MG/ML IJ SOLN
INTRAMUSCULAR | Status: DC | PRN
  Administered 2017-08-20: 12.5 mg via INTRAVENOUS

## 2017-08-20 MED ORDER — PROPOFOL 10 MG/ML IV BOLUS
INTRAVENOUS | Status: DC | PRN
  Administered 2017-08-20: 140 mg via INTRAVENOUS
  Administered 2017-08-20: 20 mg via INTRAVENOUS

## 2017-08-20 MED ORDER — ARTIFICIAL TEARS OPHTHALMIC OINT
TOPICAL_OINTMENT | OPHTHALMIC | Status: DC | PRN
  Administered 2017-08-20: 1 via OPHTHALMIC

## 2017-08-20 MED ORDER — ROCURONIUM BROMIDE 10 MG/ML (PF) SYRINGE
PREFILLED_SYRINGE | INTRAVENOUS | Status: AC
  Filled 2017-08-20: qty 5

## 2017-08-20 MED ORDER — DIPHENHYDRAMINE HCL 50 MG/ML IJ SOLN
INTRAMUSCULAR | Status: AC
  Filled 2017-08-20: qty 1

## 2017-08-20 MED ORDER — MIDAZOLAM HCL 2 MG/2ML IJ SOLN
INTRAMUSCULAR | Status: AC
  Filled 2017-08-20: qty 2

## 2017-08-20 MED ORDER — ACETAMINOPHEN 325 MG PO TABS
650.0000 mg | ORAL_TABLET | ORAL | Status: DC | PRN
  Administered 2017-08-20 – 2017-08-21 (×2): 650 mg via ORAL
  Filled 2017-08-20 (×2): qty 2

## 2017-08-20 MED ORDER — HYDROMORPHONE HCL 1 MG/ML IJ SOLN
0.2500 mg | INTRAMUSCULAR | Status: DC | PRN
  Administered 2017-08-20 (×4): 0.5 mg via INTRAVENOUS

## 2017-08-20 MED ORDER — OXYCODONE HCL 5 MG PO TABS: 5.0000 mg | ORAL_TABLET | Freq: Once | ORAL | Status: DC | PRN

## 2017-08-20 MED ORDER — ADULT MULTIVITAMIN W/MINERALS CH: 1.0000 | ORAL_TABLET | Freq: Every day | ORAL | Status: DC

## 2017-08-20 MED ORDER — BACITRACIN ZINC 500 UNIT/GM EX OINT
TOPICAL_OINTMENT | CUTANEOUS | Status: DC | PRN
  Administered 2017-08-20: 1 via TOPICAL

## 2017-08-20 MED ORDER — BISACODYL 10 MG RE SUPP: 10.0000 mg | Freq: Every day | RECTAL | Status: DC | PRN

## 2017-08-20 MED ORDER — THROMBIN (RECOMBINANT) 5000 UNITS EX SOLR
CUTANEOUS | Status: DC | PRN
  Administered 2017-08-20: 5000 [IU] via TOPICAL

## 2017-08-20 MED ORDER — CHLORHEXIDINE GLUCONATE CLOTH 2 % EX PADS: 6.0000 | MEDICATED_PAD | Freq: Once | CUTANEOUS | Status: DC

## 2017-08-20 MED ORDER — OXYCODONE HCL 5 MG/5ML PO SOLN: 5.0000 mg | Freq: Once | ORAL | Status: DC | PRN

## 2017-08-20 MED ORDER — DEXAMETHASONE SODIUM PHOSPHATE 4 MG/ML IJ SOLN: 4.0000 mg | Freq: Four times a day (QID) | INTRAMUSCULAR | Status: DC

## 2017-08-20 MED ORDER — ROCURONIUM BROMIDE 100 MG/10ML IV SOLN
INTRAVENOUS | Status: DC | PRN
  Administered 2017-08-20: 10 mg via INTRAVENOUS
  Administered 2017-08-20: 50 mg via INTRAVENOUS
  Administered 2017-08-20: 10 mg via INTRAVENOUS
  Administered 2017-08-20: 30 mg via INTRAVENOUS

## 2017-08-20 MED ORDER — DEXAMETHASONE 4 MG PO TABS
4.0000 mg | ORAL_TABLET | Freq: Four times a day (QID) | ORAL | Status: DC
  Administered 2017-08-20 – 2017-08-21 (×2): 4 mg via ORAL
  Filled 2017-08-20 (×2): qty 1

## 2017-08-20 MED ORDER — HYDROMORPHONE HCL 1 MG/ML IJ SOLN: 0.2500 mg | INTRAMUSCULAR | Status: DC | PRN

## 2017-08-20 MED ORDER — ONDANSETRON HCL 4 MG/2ML IJ SOLN
INTRAMUSCULAR | Status: AC
  Filled 2017-08-20: qty 2

## 2017-08-20 MED ORDER — MENTHOL 3 MG MT LOZG: 1.0000 | LOZENGE | OROMUCOSAL | Status: DC | PRN

## 2017-08-20 MED ORDER — MIDAZOLAM HCL 5 MG/5ML IJ SOLN
INTRAMUSCULAR | Status: DC | PRN
  Administered 2017-08-20: 2 mg via INTRAVENOUS

## 2017-08-20 MED ORDER — THROMBIN (RECOMBINANT) 5000 UNITS EX SOLR
CUTANEOUS | Status: AC
  Filled 2017-08-20: qty 5000

## 2017-08-20 MED ORDER — BUPIVACAINE-EPINEPHRINE (PF) 0.5% -1:200000 IJ SOLN
INTRAMUSCULAR | Status: DC | PRN
  Administered 2017-08-20: 10 mL via PERINEURAL

## 2017-08-20 MED ORDER — SUCCINYLCHOLINE CHLORIDE 200 MG/10ML IV SOSY
PREFILLED_SYRINGE | INTRAVENOUS | Status: AC
  Filled 2017-08-20: qty 10

## 2017-08-20 MED ORDER — ALUM & MAG HYDROXIDE-SIMETH 200-200-20 MG/5ML PO SUSP: 30.0000 mL | Freq: Four times a day (QID) | ORAL | Status: DC | PRN

## 2017-08-20 MED ORDER — ONDANSETRON HCL 4 MG/2ML IJ SOLN: 4.0000 mg | Freq: Four times a day (QID) | INTRAMUSCULAR | Status: DC | PRN

## 2017-08-20 MED ORDER — ONDANSETRON HCL 4 MG/2ML IJ SOLN
INTRAMUSCULAR | Status: DC | PRN
  Administered 2017-08-20: 4 mg via INTRAVENOUS

## 2017-08-20 MED ORDER — CEFAZOLIN SODIUM-DEXTROSE 2-4 GM/100ML-% IV SOLN
2.0000 g | Freq: Three times a day (TID) | INTRAVENOUS | Status: AC
  Administered 2017-08-20 – 2017-08-21 (×2): 2 g via INTRAVENOUS
  Filled 2017-08-20 (×2): qty 100

## 2017-08-20 MED ORDER — LACTATED RINGERS IV SOLN
INTRAVENOUS | Status: DC
  Administered 2017-08-20 (×3): via INTRAVENOUS

## 2017-08-20 MED ORDER — LIDOCAINE HCL (CARDIAC) 20 MG/ML IV SOLN
INTRAVENOUS | Status: DC | PRN
  Administered 2017-08-20: 80 mg via INTRAVENOUS

## 2017-08-20 MED ORDER — OXYCODONE HCL 5 MG PO TABS: 5.0000 mg | ORAL_TABLET | ORAL | Status: DC | PRN

## 2017-08-20 MED ORDER — ACETAMINOPHEN 650 MG RE SUPP: 650.0000 mg | RECTAL | Status: DC | PRN

## 2017-08-20 MED ORDER — DEXAMETHASONE SODIUM PHOSPHATE 10 MG/ML IJ SOLN
INTRAMUSCULAR | Status: AC
  Filled 2017-08-20: qty 1

## 2017-08-20 MED ORDER — PHENOL 1.4 % MT LIQD: 1.0000 | OROMUCOSAL | Status: DC | PRN

## 2017-08-20 MED ORDER — BACITRACIN 50000 UNITS IM SOLR
INTRAMUSCULAR | Status: DC | PRN
  Administered 2017-08-20: 12:00:00

## 2017-08-20 MED ORDER — FENTANYL CITRATE (PF) 250 MCG/5ML IJ SOLN
INTRAMUSCULAR | Status: AC
  Filled 2017-08-20: qty 5

## 2017-08-20 MED ORDER — OXYCODONE HCL 5 MG PO TABS
ORAL_TABLET | ORAL | Status: AC
  Filled 2017-08-20: qty 2

## 2017-08-20 MED ORDER — BACITRACIN ZINC 500 UNIT/GM EX OINT
TOPICAL_OINTMENT | CUTANEOUS | Status: AC
  Filled 2017-08-20: qty 28.35

## 2017-08-20 MED ORDER — ZOLPIDEM TARTRATE 5 MG PO TABS: 5.0000 mg | ORAL_TABLET | Freq: Every evening | ORAL | Status: DC | PRN

## 2017-08-20 MED ORDER — CYCLOSPORINE 0.05 % OP EMUL
1.0000 [drp] | Freq: Two times a day (BID) | OPHTHALMIC | Status: DC
  Administered 2017-08-20: 1 [drp] via OPHTHALMIC
  Filled 2017-08-20 (×2): qty 1

## 2017-08-20 MED ORDER — AMLODIPINE BESYLATE 10 MG PO TABS
10.0000 mg | ORAL_TABLET | Freq: Every day | ORAL | Status: DC
  Filled 2017-08-20: qty 1

## 2017-08-20 MED ORDER — LACTATED RINGERS IV SOLN: INTRAVENOUS | Status: DC

## 2017-08-20 MED ORDER — OXYCODONE HCL 5 MG PO TABS
10.0000 mg | ORAL_TABLET | ORAL | Status: DC | PRN
  Administered 2017-08-20 – 2017-08-21 (×5): 10 mg via ORAL
  Filled 2017-08-20 (×5): qty 2

## 2017-08-20 MED ORDER — FLUOXETINE HCL 20 MG PO CAPS: 40.0000 mg | ORAL_CAPSULE | Freq: Every day | ORAL | Status: DC

## 2017-08-20 MED ORDER — PHENYLEPHRINE HCL 10 MG/ML IJ SOLN
INTRAVENOUS | Status: DC | PRN
  Administered 2017-08-20: 10 ug/min via INTRAVENOUS

## 2017-08-20 MED ORDER — BUPIVACAINE-EPINEPHRINE (PF) 0.5% -1:200000 IJ SOLN
INTRAMUSCULAR | Status: AC
  Filled 2017-08-20: qty 30

## 2017-08-20 MED ORDER — MORPHINE SULFATE (PF) 4 MG/ML IV SOLN
4.0000 mg | INTRAVENOUS | Status: DC | PRN
  Administered 2017-08-20 – 2017-08-21 (×2): 4 mg via INTRAVENOUS
  Filled 2017-08-20 (×2): qty 1

## 2017-08-20 MED ORDER — DOCUSATE SODIUM 100 MG PO CAPS
100.0000 mg | ORAL_CAPSULE | Freq: Two times a day (BID) | ORAL | Status: DC
  Administered 2017-08-20: 100 mg via ORAL
  Filled 2017-08-20: qty 1

## 2017-08-20 MED ORDER — PROPOFOL 10 MG/ML IV BOLUS
INTRAVENOUS | Status: AC
  Filled 2017-08-20: qty 20

## 2017-08-20 SURGICAL SUPPLY — 56 items
BAG DECANTER FOR FLEXI CONT (MISCELLANEOUS) ×2 IMPLANT
BENZOIN TINCTURE PRP APPL 2/3 (GAUZE/BANDAGES/DRESSINGS) ×2 IMPLANT
BIT DRILL NEURO 2X3.1 SFT TUCH (MISCELLANEOUS) ×1 IMPLANT
BLADE SURG 15 STRL LF DISP TIS (BLADE) ×1 IMPLANT
BLADE SURG 15 STRL SS (BLADE) ×1
BLADE ULTRA TIP 2M (BLADE) ×2 IMPLANT
BUR BARREL STRAIGHT FLUTE 4.0 (BURR) ×2 IMPLANT
BUR MATCHSTICK NEURO 3.0 LAGG (BURR) ×2 IMPLANT
CANISTER SUCT 3000ML PPV (MISCELLANEOUS) ×2 IMPLANT
CARTRIDGE OIL MAESTRO DRILL (MISCELLANEOUS) ×1 IMPLANT
COVER MAYO STAND STRL (DRAPES) ×2 IMPLANT
DERMABOND ADVANCED (GAUZE/BANDAGES/DRESSINGS) ×1
DERMABOND ADVANCED .7 DNX12 (GAUZE/BANDAGES/DRESSINGS) ×1 IMPLANT
DIFFUSER DRILL AIR PNEUMATIC (MISCELLANEOUS) ×2 IMPLANT
DRAPE LAPAROTOMY 100X72 PEDS (DRAPES) ×2 IMPLANT
DRAPE MICROSCOPE LEICA (MISCELLANEOUS) ×2 IMPLANT
DRAPE POUCH INSTRU U-SHP 10X18 (DRAPES) ×2 IMPLANT
DRAPE SURG 17X23 STRL (DRAPES) ×4 IMPLANT
DRILL NEURO 2X3.1 SOFT TOUCH (MISCELLANEOUS) ×2
ELECT REM PT RETURN 9FT ADLT (ELECTROSURGICAL) ×2
ELECTRODE REM PT RTRN 9FT ADLT (ELECTROSURGICAL) ×1 IMPLANT
GAUZE SPONGE 4X4 12PLY STRL (GAUZE/BANDAGES/DRESSINGS) ×2 IMPLANT
GAUZE SPONGE 4X4 16PLY XRAY LF (GAUZE/BANDAGES/DRESSINGS) IMPLANT
GLOVE BIO SURGEON STRL SZ8 (GLOVE) ×2 IMPLANT
GLOVE BIO SURGEON STRL SZ8.5 (GLOVE) ×2 IMPLANT
GLOVE EXAM NITRILE LRG STRL (GLOVE) IMPLANT
GLOVE EXAM NITRILE XL STR (GLOVE) IMPLANT
GLOVE EXAM NITRILE XS STR PU (GLOVE) IMPLANT
GOWN STRL REUS W/ TWL LRG LVL3 (GOWN DISPOSABLE) ×2 IMPLANT
GOWN STRL REUS W/ TWL XL LVL3 (GOWN DISPOSABLE) ×1 IMPLANT
GOWN STRL REUS W/TWL LRG LVL3 (GOWN DISPOSABLE) ×2
GOWN STRL REUS W/TWL XL LVL3 (GOWN DISPOSABLE) ×1
HEMOSTAT POWDER KIT SURGIFOAM (HEMOSTASIS) ×2 IMPLANT
KIT BASIN OR (CUSTOM PROCEDURE TRAY) ×2 IMPLANT
KIT ROOM TURNOVER OR (KITS) ×2 IMPLANT
MARKER SKIN DUAL TIP RULER LAB (MISCELLANEOUS) ×2 IMPLANT
NEEDLE HYPO 22GX1.5 SAFETY (NEEDLE) ×2 IMPLANT
NEEDLE SPNL 18GX3.5 QUINCKE PK (NEEDLE) ×2 IMPLANT
NS IRRIG 1000ML POUR BTL (IV SOLUTION) ×2 IMPLANT
OIL CARTRIDGE MAESTRO DRILL (MISCELLANEOUS) ×2
PACK LAMINECTOMY NEURO (CUSTOM PROCEDURE TRAY) ×2 IMPLANT
PEEK S VISTA 7X11X14 (Peek) ×4 IMPLANT
PIN DISTRACTION 14MM (PIN) ×4 IMPLANT
PLATE ANT CERV XTEND 2 LV 30 (Plate) ×2 IMPLANT
PUTTY KINEX BIOACTIVE 5CC (Bone Implant) ×2 IMPLANT
RUBBERBAND STERILE (MISCELLANEOUS) ×4 IMPLANT
SCREW XTD VAR 4.2 SELF TAP 12 (Screw) ×12 IMPLANT
SPONGE INTESTINAL PEANUT (DISPOSABLE) ×4 IMPLANT
SPONGE SURGIFOAM ABS GEL SZ50 (HEMOSTASIS) IMPLANT
STRIP CLOSURE SKIN 1/2X4 (GAUZE/BANDAGES/DRESSINGS) ×2 IMPLANT
SUT VIC AB 0 CT1 27 (SUTURE) ×1
SUT VIC AB 0 CT1 27XBRD ANTBC (SUTURE) ×1 IMPLANT
SUT VIC AB 3-0 SH 8-18 (SUTURE) ×2 IMPLANT
TOWEL GREEN STERILE (TOWEL DISPOSABLE) ×2 IMPLANT
TOWEL GREEN STERILE FF (TOWEL DISPOSABLE) ×2 IMPLANT
WATER STERILE IRR 1000ML POUR (IV SOLUTION) ×2 IMPLANT

## 2017-08-20 NOTE — Op Note (Signed)
Brief history: The patient is a 56 year old white female who has complained of neck and arm pain consistent with a cervical radiculopathy. She has failed medical management and was worked up with a cervical MRI. 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 I discussed the various treatment with patient including surgery. She has weighed the risks, benefits, and alternatives to surgery and decided proceed with a C5-6 and C6-7 anterior cervical discectomy, fusion and plating.  Preoperative diagnosis: C5-6 and C6-7 disc degeneration, spondylosis, stenosis, cervicalgia, cervical radiculopathy  Postoperative diagnosis: The same  Procedure: C5-6 and C6-7 Anterior cervical discectomy/decompression; C5-6 and C6-7 interbody arthrodesis with local morcellized autograft bone and Kinnex bone graft extender; insertion of interbody prosthesis at C5-6 and C6-7 (Zimmer peek interbody prosthesis); anterior cervical plating from C5-C7 with globus titanium plate  Surgeon: Dr. Delma Officer  Asst.: Dr. Mikal Plane  Anesthesia: Gen. endotracheal  Estimated blood loss: 100 mL  Drains: None  Complications: None  Description of procedure: The patient was brought to the operating room by the anesthesia team. General endotracheal anesthesia was induced. A roll was placed under the patient's shoulders to keep the neck in the neutral position. The patient's anterior cervical region was then prepared with Betadine scrub and Betadine solution. Sterile drapes were applied.  The area to be incised was then injected with Marcaine with epinephrine solution. I then used a scalpel to make a transverse incision in the patient's left anterior neck. I used the Metzenbaum scissors to divide the platysmal muscle and then to dissect medial to the sternocleidomastoid muscle, jugular vein, and carotid artery. I carefully dissected down towards the anterior cervical spine identifying the esophagus and retracting it  medially. Then using Kitner swabs to clear soft tissue from the anterior cervical spine. We then inserted a bent spinal needle into the upper exposed intervertebral disc space. We then obtained intraoperative radiographs confirm our location.  I then used electrocautery to detach the medial border of the longus colli muscle bilaterally from the C5-6 and C6-7 intervertebral disc spaces. I then inserted the Caspar self-retaining retractor underneath the longus colli muscle bilaterally to provide exposure.  We then incised the intervertebral disc at C5-6. We then performed a partial intervertebral discectomy with a pituitary forceps and the Karlin curettes. I then inserted distraction screws into the vertebral bodies at C5-6. We then distracted the interspace. We then used the high-speed drill to decorticate the vertebral endplates at C5-6, to drill away the remainder of the intervertebral disc, to drill away some posterior spondylosis, and to thin out the posterior longitudinal ligament. I then incised ligament with the arachnoid knife. We then removed the ligament with a Kerrison punches undercutting the vertebral endplates and decompressing the thecal sac. We then performed foraminotomies about the bilateral C6 nerve roots. This completed the decompression at this level.  We then repeated this procedure in an analogous fashion and C6-7 decompressing the thecal sac and the bilateral C7 nerve roots.  We now turned our to attention to the interbody fusion. We used the trial spacers to determine the appropriate size for the interbody prosthesis. We then pre-filled prosthesis with a combination of local morcellized autograft bone that we obtained during decompression as well as Kinnex bone graft extender. We then inserted the prosthesis into the distracted interspace at C5-6 and C6-7. We then removed the distraction screws. There was a good snug fit of the prosthesis in the interspace.  Having completed the  fusion we now turned attention to the  anterior spinal instrumentation. We used the high-speed drill to drill away some anterior spondylosis at the disc spaces so that the plate lay down flat. We selected the appropriate length titanium anterior cervical plate. We laid it along the anterior aspect of the vertebral bodies from C5-C7. We then drilled 12 mm holes at C5, C6 and C7. We then secured the plate to the vertebral bodies by placing two 12 mm self-tapping screws at C5, C6 and C7. We then obtained intraoperative radiograph. The demonstrating good position of the instrumentation. We therefore secured the screws the plate the locking each cam. This completed the instrumentation.  We then obtained hemostasis using bipolar electrocautery. We irrigated the wound out with bacitracin solution. We then removed the retractor. We inspected the esophagus for any damage. There was none apparent. We then reapproximated patient's platysmal muscle with interrupted 3-0 Vicryl suture. We then reapproximated the subcutaneous tissue with interrupted 3-0 Vicryl suture. The skin was reapproximated with Steri-Strips and benzoin. The wound was then covered with bacitracin ointment. A sterile dressing was applied. The drapes were removed. Patient was subsequently extubated by the anesthesia team and transported to the post anesthesia care unit in stable condition. All sponge instrument and needle counts were reportedly correct at the end of this case.

## 2017-08-20 NOTE — Progress Notes (Signed)
Patient ID: Doris Padilla, female   DOB: [birth year], 56 y.o.   MRN: [id number] Subjective:  The patient is alert and pleasant. She is in no apparent distress.  Objective: Vital signs in last 24 hours: Temp:  [98.5 F (36.9 C)-99.1 F (37.3 C)] 99.1 F (37.3 C) (11/07 1450) Pulse Rate:  [84-125] 115 (11/07 1550) Resp:  [11-20] 13 (11/07 1550) BP: (127-149)/(77-88) 127/88 (11/07 1550) SpO2:  [94 %-97 %] 95 % (11/07 1550) Weight:  [70 kg (154 lb 6 oz)] 70 kg (154 lb 6 oz) (11/07 1018)  Intake/Output from previous day: No intake/output data recorded. Intake/Output this shift: Total I/O In: 1400 [I.V.:1400] Out: 100 [Blood:100]  Physical exam the patient is alert and pleasant. She is moving all 4 extremities well.  The patient's dressing is clean and dry. There is no hematoma or shift.  Lab Results: No results for input(s): WBC, HGB, HCT, PLT in the last 72 hours. BMET No results for input(s): NA, K, CL, CO2, GLUCOSE, BUN, CREATININE, CALCIUM in the last 72 hours.  Studies/Results: Dg Cervical Spine 2-3 Views  Result Date: 08/20/2017 CLINICAL DATA:  Initial evaluation for ACDF of C5-6 and C6-7. EXAM: CERVICAL SPINE - 2-3 VIEW COMPARISON:  Prior MRI from 07/10/2017. FINDINGS: Two lateral views of the cervical spine are provided. First image demonstrates localization hardware at the anterior aspect of the C5-6 interspace. Second image demonstrates placement of ACDF at C5 through C7. No complication. Alignment is physiologic and within normal limits. IMPRESSION: Intraoperative views for ACDF at C5-6 and C6-7. Electronically Signed   By: Rise Mu M.D.   On: 08/20/2017 14:44    Assessment/Plan: The patient is doing well. I have spoken with her husband. She is likely going to stay overnight.  LOS: 0 days     Jorrell Kuster D 08/20/2017, 3:55 PM

## 2017-08-20 NOTE — Anesthesia Preprocedure Evaluation (Addendum)
Anesthesia Evaluation  Patient identified by MRN, date of birth, ID band Patient awake    Reviewed: Allergy & Precautions, NPO status , Patient's Chart, lab work & pertinent test results  History of Anesthesia Complications Negative for: history of anesthetic complications  Airway Mallampati: II  TM Distance: >3 FB Neck ROM: Full    Dental  (+) Teeth Intact   Pulmonary sleep apnea , Current Smoker,    breath sounds clear to auscultation       Cardiovascular hypertension,  Rhythm:Regular Rate:Normal     Neuro/Psych    GI/Hepatic negative GI ROS, Neg liver ROS,   Endo/Other  negative endocrine ROS  Renal/GU negative Renal ROS     Musculoskeletal  (+) Arthritis ,   Abdominal   Peds  Hematology negative hematology ROS (+)   Anesthesia Other Findings   Reproductive/Obstetrics                            Anesthesia Physical Anesthesia Plan  ASA: II  Anesthesia Plan: General   Post-op Pain Management:    Induction: Intravenous  PONV Risk Score and Plan: 3 and Treatment may vary due to age or medical condition, Dexamethasone and Ondansetron  Airway Management Planned: Oral ETT  Additional Equipment:   Intra-op Plan:   Post-operative Plan: Extubation in OR  Informed Consent: I have reviewed the patients History and Physical, chart, labs and discussed the procedure including the risks, benefits and alternatives for the proposed anesthesia with the patient or authorized representative who has indicated his/her understanding and acceptance.   Dental advisory given  Plan Discussed with: CRNA  Anesthesia Plan Comments:        Anesthesia Quick Evaluation

## 2017-08-20 NOTE — Transfer of Care (Signed)
Immediate Anesthesia Transfer of Care Note  Patient: Doris Padilla  Procedure(s) Performed: ANTERIOR CERVICAL DECOMPRESSION/DISCECTOMY FUSION, INTERBODY PROSTHESIS, PLATE/SCREWS CERVICAL FIVE- CERVICAL SIX , CERVICAL SIX - CERVICAL SEVEN (N/A )  Patient Location: PACU  Anesthesia Type:General  Level of Consciousness: awake, alert  and oriented  Airway & Oxygen Therapy: Patient connected to face mask oxygen  Post-op Assessment: Post -op Vital signs reviewed and stable  Post vital signs: stable  Last Vitals:  Vitals:   08/20/17 1018 08/20/17 1448  BP: (!) 149/80   Pulse: 84 (!) 119  Resp: 18   Temp: 36.9 C   SpO2: 96%     Last Pain:  Vitals:   08/20/17 1018  TempSrc: Oral      Patients Stated Pain Goal: 2 (08/20/17 1051)  Complications: No apparent anesthesia complications

## 2017-08-20 NOTE — Anesthesia Procedure Notes (Signed)
Procedure Name: Intubation Date/Time: 08/20/2017 12:03 PM Performed by: Lavell Luster, CRNA Pre-anesthesia Checklist: Patient identified, Emergency Drugs available, Suction available, Timeout performed and Patient being monitored Patient Re-evaluated:Patient Re-evaluated prior to induction Oxygen Delivery Method: Circle system utilized Preoxygenation: Pre-oxygenation with 100% oxygen Induction Type: IV induction Ventilation: Mask ventilation without difficulty Laryngoscope Size: Mac and 3 Grade View: Grade II Tube type: Oral Tube size: 7.0 mm Number of attempts: 1 Airway Equipment and Method: Stylet Placement Confirmation: ETT inserted through vocal cords under direct vision,  positive ETCO2 and breath sounds checked- equal and bilateral Secured at: 22 cm Tube secured with: Tape Dental Injury: Teeth and Oropharynx as per pre-operative assessment

## 2017-08-20 NOTE — H&P (Signed)
Subjective: The patient is a 56 year old white female who has complained of neck and arm pain consistent with a cervical radiculopathy. She has failed medical management and was worked up with a cervical MRI. This demonstrated disc degeneration, spondylosis, stenosis, etc. At C5-6 and C6-7. We discussed the various treatment options including surgery. She has weighed the risks, benefits, and alternatives to surgery and decided to proceed with a C5-6 and C6-7 anterior cervical discectomy, fusion, and plating.   Past Medical History:  Diagnosis Date  . Anxiety   . Arthritis    RA  . Chronic pain   . Depression   . History of blood transfusion 2010  . History of kidney stones    followed by cystocopy  . Hypertension   . Sjogren's disease (HCC)   . Sleep apnea 2015   sleep study produced need for CPAP, insurance lapped, CPAP returned  but pt. want help in getting it back    Past Surgical History:  Procedure Laterality Date  . ABDOMINAL HYSTERECTOMY    . CESAREAN SECTION     x3  . CHOLECYSTECTOMY      Allergies  Allergen Reactions  . Other Hives  . Sudafed Pe [Phenylephrine] Hives    Social History   Tobacco Use  . Smoking status: Current Every Day Smoker    Packs/day: 0.50    Types: Cigarettes  . Smokeless tobacco: Never Used  Substance Use Topics  . Alcohol use: No    No family history on file. Prior to Admission medications   Medication Sig Start Date End Date Taking? Authorizing Provider  amLODipine (NORVASC) 10 MG tablet Take 10 mg by mouth daily.   Yes [provider]  aspirin EC 81 MG tablet Take 81 mg by mouth daily.   Yes [provider]  azithromycin (ZITHROMAX) 500 MG tablet Take 500 mg by mouth daily. 3 day course started 08-12-17   Yes [provider]  clonazePAM (KLONOPIN) 1 MG tablet Take 1 mg by mouth daily as needed for anxiety.    Yes [provider]  cycloSPORINE (RESTASIS) 0.05 % ophthalmic emulsion Place 1 drop into  both eyes 2 (two) times daily.   Yes [provider]  FLUoxetine (PROZAC) 40 MG capsule Take 40 mg by mouth daily before breakfast.    Yes [provider]  fluticasone (FLONASE) 50 MCG/ACT nasal spray SPRAY 1 SPRAY INTO EACH NOSTRIL EVERY DAYAS NEEDED FOR ALLERGIES 05/13/17  Yes [provider]  ibuprofen (ADVIL,MOTRIN) 200 MG tablet Take 400 mg by mouth every 8 (eight) hours as needed for headache.   Yes [provider]  Multiple Vitamin (MULTIVITAMIN WITH MINERALS) TABS tablet Take 1 tablet by mouth daily.   Yes [provider]  orphenadrine (NORFLEX) 100 MG tablet TAKE 1 TABLET BY MOUTH 2 TIMES DAILY FOR 10 DAYS. 07/22/17  Yes [provider]  Oxycodone HCl 10 MG TABS TAKE 1 TABLET 3 TIMES A DAY AS NEEDED FOR PAIN 07/24/17  Yes [provider]  diazepam (VALIUM) 5 MG tablet Take 1 tablet (5 mg total) by mouth every 8 (eight) hours as needed for anxiety. Patient not taking: Reported on 08/13/2017 06/26/17   Raeford Razor, MD  oxyCODONE-acetaminophen (PERCOCET/ROXICET) 5-325 MG tablet Take 1-2 tablets by mouth every 4 (four) hours as needed for severe pain. Patient not taking: Reported on 08/13/2017 06/26/17   Raeford Razor, MD     Review of Systems  Positive ROS: as above  All other systems have been reviewed  and were otherwise negative with the exception of those mentioned in the HPI and as above.  Objective: Vital signs in last 24 hours: Temp:  [98.5 F (36.9 C)] 98.5 F (36.9 C) (11/07 1018) Pulse Rate:  [84] 84 (11/07 1018) Resp:  [18] 18 (11/07 1018) BP: (149)/(80) 149/80 (11/07 1018) SpO2:  [96 %] 96 % (11/07 1018) Weight:  [70 kg (154 lb 6 oz)] 70 kg (154 lb 6 oz) (11/07 1018)  General Appearance: Alert Head: Normocephalic, without obvious abnormality, atraumatic Eyes: PERRL, conjunctiva/corneas clear, EOM's intact,    Ears: Normal  Throat: Normal  Neck: Supple,positive Spurling's testing Back:  unremarkable Lungs: Clear to auscultation bilaterally, respirations unlabored Heart: Regular rate and rhythm, no murmur, rub or gallop Abdomen: Soft, non-tender Extremities: Extremities normal, atraumatic, no cyanosis or edema Skin: unremarkable  NEUROLOGIC:   Mental status: alert and oriented,Motor Exam - grossly normal Sensory Exam - grossly normal Reflexes:  Coordination - grossly normal Gait - grossly normal Balance - grossly normal Cranial Nerves: I: smell Not tested  II: visual acuity  OS: Normal  OD: Normal   II: visual fields Full to confrontation  II: pupils Equal, round, reactive to light  III,VII: ptosis None  III,IV,VI: extraocular muscles  Full ROM  V: mastication Normal  V: facial light touch sensation  Normal  V,VII: corneal reflex  Present  VII: facial muscle function - upper  Normal  VII: facial muscle function - lower Normal  VIII: hearing Not tested  IX: soft palate elevation  Normal  IX,X: gag reflex Present  XI: trapezius strength  5/5  XI: sternocleidomastoid strength 5/5  XI: neck flexion strength  5/5  XII: tongue strength  Normal    Data Review Lab Results  Component Value Date   WBC 11.4 (H) 08/15/2017   HGB 15.2 (H) 08/15/2017   HCT 43.5 08/15/2017   MCV 85.8 08/15/2017   PLT 438 (H) 08/15/2017   Lab Results  Component Value Date   NA 137 08/15/2017   K 3.6 08/15/2017   CL 103 08/15/2017   CO2 23 08/15/2017   BUN 6 08/15/2017   CREATININE 0.65 08/15/2017   GLUCOSE 96 08/15/2017   No results found for: INR, PROTIME  Assessment/Plan: C5-6 and C6-7 disc degeneration, spondylosis, stenosis, cervicalgia, cervical radiculopathy: I have discussed the situation with the patient and reviewed her imaging studies with her. We have discussed the various treatment options including surgery. I described the surgical treatment option of the C5-6 and C6-7 anterior cervical discectomy, fusion, and plating. I have shown her surgical models. I have  given her surgical pamphlet. We have discussed the risks, benefits, alternatives, expected postoperative course, and likelihood of achieving her goals with surgery. I have answered all her questions. She has decided to proceed with surgery.   Jarrod Bodkins D 08/20/2017 11:55 AM

## 2017-08-21 ENCOUNTER — Encounter (HOSPITAL_COMMUNITY): Payer: Self-pay | Admitting: Neurosurgery

## 2017-08-21 DIAGNOSIS — M50122 Cervical disc disorder at C5-C6 level with radiculopathy: Secondary | ICD-10-CM | POA: Diagnosis not present

## 2017-08-21 MED ORDER — DOCUSATE SODIUM 100 MG PO CAPS: 100.0000 mg | ORAL_CAPSULE | Freq: Two times a day (BID) | ORAL | 0 refills | Status: AC

## 2017-08-21 MED ORDER — OXYCODONE HCL 10 MG PO TABS: 10.0000 mg | ORAL_TABLET | ORAL | 0 refills | Status: AC | PRN

## 2017-08-21 NOTE — Care Management Note (Signed)
Case Management Note  Patient Details  Name: Doris Padilla MRN: [id number] Date of Birth: Nov 03, 1960  Subjective/Objective:                 Patient with order to DC to home today. Chart reviewed. No Home Health or Equipment needs, no unacknowledged Case Management consults or medication needs identified at the time of this note. Plan for DC to home. If needs arise today prior to discharge, please call Lawerance Sabal RN CM at [phone]. CM signing off   Action/Plan:   Expected Discharge Date:  08/21/17               Expected Discharge Plan:  Home/Self Care  In-House Referral:     Discharge planning Services  CM Consult  Post Acute Care Choice:    Choice offered to:     DME Arranged:    DME Agency:     HH Arranged:    HH Agency:     Status of Service:  Completed, signed off  If discussed at [employer] of Stay Meetings, dates discussed:    Additional Comments:  Lawerance Sabal, RN 08/21/2017, 8:39 AM

## 2017-08-21 NOTE — Discharge Instructions (Signed)

## 2017-08-21 NOTE — Discharge Summary (Signed)
Physician Discharge Summary  Patient ID: Doris Padilla MRN: [id number] DOB/AGE: [birth year] 56 y.o.  Admit date: 08/20/2017 Discharge date: 08/21/2017  Admission Diagnoses: C5-6 and C6-7 disc degeneration, cervicalgia, cervical radiculopathy, cervical kyphosis  Discharge Diagnoses: The same Active Problems:   Cervical spondylosis with radiculopathy   Discharged Condition: good  Hospital Course: I performed a C5-6 and C6-7 anterior cervical discectomy fusion and plating on the patient on 08/20/2017.  The surgery went well.  The patient's postoperative course was unremarkable.  On postoperative day #1 she requested discharge of home.  The patient, and her husband, were given written and oral discharge instructions.  All their questions were answered.  Consults: None Significant Diagnostic Studies: None Treatments: C5-6 and C6-7 anterior cervical discectomy, fusion, and plating. Discharge Exam: Blood pressure 134/84, pulse 98, temperature 97.9 F (36.6 C), temperature source Oral, resp. rate 20, height 5\' 1"  (1.549 m), weight 70 kg (154 lb 6 oz), SpO2 94 %. The patient is alert and pleasant.  She looks well.  Her dressing is clean and dry.  There is no evidence of hematoma or shift.  Her strength is normal in all 4 extremities.  Disposition: Home  Discharge Instructions    Call MD for:  difficulty breathing, headache or visual disturbances   Complete by:  As directed    Call MD for:  extreme fatigue   Complete by:  As directed    Call MD for:  hives   Complete by:  As directed    Call MD for:  persistant dizziness or light-headedness   Complete by:  As directed    Call MD for:  persistant nausea and vomiting   Complete by:  As directed    Call MD for:  redness, tenderness, or signs of infection (pain, swelling, redness, odor or green/yellow discharge around incision site)   Complete by:  As directed    Call MD for:  severe uncontrolled pain   Complete by:  As directed    Call MD  for:  temperature >100.4   Complete by:  As directed    Diet - low sodium heart healthy   Complete by:  As directed    Discharge instructions   Complete by:  As directed    Call [phone] for a followup appointment. Take a stool softener while you are using pain medications.   Driving Restrictions   Complete by:  As directed    Do not drive for 2 weeks.   Increase activity slowly   Complete by:  As directed    Lifting restrictions   Complete by:  As directed    Do not lift more than 5 pounds. No excessive bending or twisting.   May shower / Bathe   Complete by:  As directed    He may shower after the pain she is removed 3 days after surgery. Leave the incision alone.   Remove dressing in 48 hours   Complete by:  As directed    Your stitches are under the scan and will dissolve by themselves. The Steri-Strips will fall off after you take a few showers. Do not rub back or pick at the wound, Leave the wound alone.     Allergies as of 08/21/2017      Reactions   Other Hives   Sudafed Pe [phenylephrine] Hives      Medication List    STOP taking these medications   azithromycin 500 MG tablet Commonly known as:  ZITHROMAX   diazepam 5 MG  tablet Commonly known as:  VALIUM   ibuprofen 200 MG tablet Commonly known as:  ADVIL,MOTRIN   oxyCODONE-acetaminophen 5-325 MG tablet Commonly known as:  PERCOCET/ROXICET     TAKE these medications   amLODipine 10 MG tablet Commonly known as:  NORVASC Take 10 mg by mouth daily.   aspirin EC 81 MG tablet Take 81 mg by mouth daily.   clonazePAM 1 MG tablet Commonly known as:  KLONOPIN Take 1 mg by mouth daily as needed for anxiety.   cycloSPORINE 0.05 % ophthalmic emulsion Commonly known as:  RESTASIS Place 1 drop into both eyes 2 (two) times daily.   docusate sodium 100 MG capsule Commonly known as:  COLACE Take 1 capsule (100 mg total) 2 (two) times daily by mouth.   FLUoxetine 40 MG capsule Commonly known as:   PROZAC Take 40 mg by mouth daily before breakfast.   fluticasone 50 MCG/ACT nasal spray Commonly known as:  FLONASE SPRAY 1 SPRAY INTO EACH NOSTRIL EVERY DAYAS NEEDED FOR ALLERGIES   multivitamin with minerals Tabs tablet Take 1 tablet by mouth daily.   orphenadrine 100 MG tablet Commonly known as:  NORFLEX TAKE 1 TABLET BY MOUTH 2 TIMES DAILY FOR 10 DAYS.   Oxycodone HCl 10 MG Tabs TAKE 1 TABLET 3 TIMES A DAY AS NEEDED FOR PAIN What changed:  Another medication with the same name was added. Make sure you understand how and when to take each.   Oxycodone HCl 10 MG Tabs Take 1 tablet (10 mg total) every 3 (three) hours as needed by mouth for severe pain ((score 7 to 10)). What changed:  You were already taking a medication with the same name, and this prescription was added. Make sure you understand how and when to take each.        SignedCristi Loron 08/21/2017, 7:42 AM

## 2017-08-21 NOTE — Progress Notes (Signed)
Pt doing well. Pt and husband given D/C instructions with Rx's, verbal understanding was provided. Pt's incision is clean and dry with no sign of infection. Pt's IV was removed prior to D/C. Pt D/C'd home via wheelchair per MD order. Pt is stable @ D/C and has no other needs at this time. Seretha Estabrooks, RN  

## 2017-08-21 NOTE — Plan of Care (Signed)
  Progressing Activity: Ability to avoid complications of mobility impairment will improve 08/21/2017 0517 - Progressing by Wille Celeste, RN Ability to tolerate increased activity will improve 08/21/2017 0517 - Progressing by Wille Celeste, RN Will remain free from falls 08/21/2017 0517 - Progressing by Wille Celeste, RN Education: Ability to verbalize activity precautions or restrictions will improve 08/21/2017 0517 - Progressing by Wille Celeste, RN Knowledge of the prescribed therapeutic regimen will improve 08/21/2017 0517 - Progressing by Wille Celeste, RN Understanding of discharge needs will improve 08/21/2017 0517 - Progressing by Wille Celeste, RN Physical Regulation: Ability to maintain clinical measurements within normal limits will improve 08/21/2017 0517 - Progressing by Wille Celeste, RN Postoperative complications will be avoided or minimized 08/21/2017 0517 - Progressing by Wille Celeste, RN Diagnostic test results will improve 08/21/2017 0517 - Progressing by Wille Celeste, RN Pain Management: Pain level will decrease 08/21/2017 0517 - Progressing by Wille Celeste, RN Skin Integrity: Signs of wound healing will improve 08/21/2017 0517 - Progressing by Wille Celeste, RN Health Behavior/Discharge Planning: Identification of resources available to assist in meeting health care needs will improve 08/21/2017 0517 - Progressing by Wille Celeste, RN

## 2017-08-22 NOTE — Anesthesia Postprocedure Evaluation (Signed)
Anesthesia Post Note  Patient: Keiva Hinch  Procedure(s) Performed: ANTERIOR CERVICAL DECOMPRESSION/DISCECTOMY FUSION, INTERBODY PROSTHESIS, PLATE/SCREWS CERVICAL FIVE- CERVICAL SIX , CERVICAL SIX - CERVICAL SEVEN (N/A )     Patient location during evaluation: PACU Anesthesia Type: General Level of consciousness: awake, oriented and sedated Pain management: pain level controlled Vital Signs Assessment: post-procedure vital signs reviewed and stable Respiratory status: spontaneous breathing, nonlabored ventilation, respiratory function stable and patient connected to nasal cannula oxygen Cardiovascular status: blood pressure returned to baseline and stable Postop Assessment: no apparent nausea or vomiting Anesthetic complications: no    Last Vitals:  Vitals:   08/21/17 0349 08/21/17 0800  BP: 134/84 (!) 144/96  Pulse: 98 65  Resp: 20 18  Temp: 36.6 C 37.1 C  SpO2: 94% 94%    Last Pain:  Vitals:   08/21/17 0900  TempSrc:   PainSc: 7                  Dasean Brow,JAMES TERRILL

## 2019-01-30 ENCOUNTER — Emergency Department (HOSPITAL_BASED_OUTPATIENT_CLINIC_OR_DEPARTMENT_OTHER)
Admission: EM | Admit: 2019-01-30 | Discharge: 2019-01-30 | Disposition: A | Discharge status: Home / Self Care | Payer: Medicaid Other | Attending: Emergency Medicine | Admitting: Emergency Medicine

## 2019-01-30 ENCOUNTER — Emergency Department (HOSPITAL_BASED_OUTPATIENT_CLINIC_OR_DEPARTMENT_OTHER): Payer: Medicaid Other

## 2019-01-30 ENCOUNTER — Encounter (HOSPITAL_BASED_OUTPATIENT_CLINIC_OR_DEPARTMENT_OTHER): Payer: Self-pay | Admitting: *Deleted

## 2019-01-30 ENCOUNTER — Other Ambulatory Visit: Payer: Self-pay

## 2019-01-30 DIAGNOSIS — Z79899 Other long term (current) drug therapy: Secondary | ICD-10-CM | POA: Diagnosis not present

## 2019-01-30 DIAGNOSIS — F17210 Nicotine dependence, cigarettes, uncomplicated: Secondary | ICD-10-CM | POA: Diagnosis not present

## 2019-01-30 DIAGNOSIS — M79672 Pain in left foot: Secondary | ICD-10-CM | POA: Diagnosis present

## 2019-01-30 DIAGNOSIS — I10 Essential (primary) hypertension: Secondary | ICD-10-CM | POA: Diagnosis not present

## 2019-01-30 DIAGNOSIS — M069 Rheumatoid arthritis, unspecified: Secondary | ICD-10-CM | POA: Insufficient documentation

## 2019-01-30 DIAGNOSIS — Z7982 Long term (current) use of aspirin: Secondary | ICD-10-CM | POA: Insufficient documentation

## 2019-01-30 NOTE — Discharge Instructions (Signed)
You can take 1000 mg of Tylenol.  Do not exceed 4000 mg of Tylenol a day.  As we discussed, try freezing a water bottle and rolling your heel over.  Return the emergency department for any fever, redness or swelling of the foot or any other worsening or concerning symptoms.

## 2019-01-30 NOTE — ED Provider Notes (Signed)
[facility] EMERGENCY DEPARTMENT Provider Note   CSN: [id number] Arrival date & time: 01/30/19  9179    History   Chief Complaint Chief Complaint  Patient presents with  . Foot Pain    HPI Doris Padilla is a 58 y.o. female past medical history of anxiety, arthritis, depression, kidney stones, hypertension, Sjogren's who presents for evaluation of left heel pain that is been ongoing for the last several months.  She states that pain initially began about 2-1/2 months ago.  She denies any preceding trauma or injury.  Patient states that most the pain is in the left heel.  She states that the pain is worse in the morning when she first gets up.  As she walks around throughout the day, gets slightly better and then is worse again at night.  She also reports it is worse if she has been sitting for a long time and tries to get up.  She states that over the last several days, she felt like "something sharp is poking on the heel."  Patient has been taking anti-inflammatories with minimal improvement in symptoms.  Patient denies any fevers, numbness/weakness, warmth, erythema.     The history is provided by the patient.    Past Medical History:  Diagnosis Date  . Anxiety   . Arthritis    RA  . Chronic pain   . Depression   . History of blood transfusion 2010  . History of kidney stones    followed by cystocopy  . Hypertension   . Sjogren's disease (HCC)   . Sleep apnea 2015   sleep study produced need for CPAP, insurance lapped, CPAP returned  but pt. want help in getting it back    Patient Active Problem List   Diagnosis Date Noted  . Cervical spondylosis with radiculopathy 08/20/2017    Past Surgical History:  Procedure Laterality Date  . ABDOMINAL HYSTERECTOMY    . ANTERIOR CERVICAL DECOMP/DISCECTOMY FUSION N/A 08/20/2017   Procedure: ANTERIOR CERVICAL DECOMPRESSION/DISCECTOMY FUSION, INTERBODY PROSTHESIS, PLATE/SCREWS CERVICAL FIVE- CERVICAL SIX , CERVICAL SIX -  CERVICAL SEVEN;  Surgeon: Tressie Stalker, MD;  Location: [facility] OR;  Service: Neurosurgery;  Laterality: N/A;  . CESAREAN SECTION     x3  . CHOLECYSTECTOMY       OB History   No obstetric history on file.      Home Medications    Prior to Admission medications   Medication Sig Start Date End Date Taking? Authorizing Provider  amLODipine (NORVASC) 10 MG tablet Take 10 mg by mouth daily.    [provider]  aspirin EC 81 MG tablet Take 81 mg by mouth daily.    [provider]  clonazePAM (KLONOPIN) 1 MG tablet Take 1 mg by mouth daily as needed for anxiety.     [provider]  cycloSPORINE (RESTASIS) 0.05 % ophthalmic emulsion Place 1 drop into both eyes 2 (two) times daily.    [provider]  docusate sodium (COLACE) 100 MG capsule Take 1 capsule (100 mg total) 2 (two) times daily by mouth. 08/21/17   Tressie Stalker, MD  FLUoxetine (PROZAC) 40 MG capsule Take 40 mg by mouth daily before breakfast.     [provider]  fluticasone (FLONASE) 50 MCG/ACT nasal spray SPRAY 1 SPRAY INTO EACH NOSTRIL EVERY DAYAS NEEDED FOR ALLERGIES 05/13/17   [provider]  Multiple Vitamin (MULTIVITAMIN WITH MINERALS) TABS tablet Take 1 tablet by mouth daily.    [provider]  orphenadrine (NORFLEX)  100 MG tablet TAKE 1 TABLET BY MOUTH 2 TIMES DAILY FOR 10 DAYS. 07/22/17   [provider]  oxyCODONE 10 MG TABS Take 1 tablet (10 mg total) every 3 (three) hours as needed by mouth for severe pain ((score 7 to 10)). 08/21/17   Tressie StalkerJenkins, Jeffrey, MD  Oxycodone HCl 10 MG TABS TAKE 1 TABLET 3 TIMES A DAY AS NEEDED FOR PAIN 07/24/17   [provider]    Family History No family history on file.  Social History Social History   Tobacco Use  . Smoking status: Current Every Day Smoker    Packs/day: 0.50    Types: Cigarettes  . Smokeless tobacco: Never Used  Substance Use Topics  . Alcohol use: No  . Drug use: No      Allergies   Other and Sudafed pe [phenylephrine]   Review of Systems Review of Systems  Constitutional: Negative for fever.  Musculoskeletal:       Left heel pain  Skin: Negative for color change.  Neurological: Negative for weakness.  All other systems reviewed and are negative.    Physical Exam Updated Vital Signs BP (!) 141/85 (BP Location: Right Arm)   Pulse 81   Temp 98.1 F (36.7 C) (Oral)   Resp 18   Ht 5\' 1"  (1.549 m)   Wt 65.8 kg   SpO2 96%   BMI 27.40 kg/m   Physical Exam Vitals signs and nursing note reviewed.  Constitutional:      Appearance: She is well-developed.  HENT:     Head: Normocephalic and atraumatic.  Eyes:     General: No scleral icterus.       Right eye: No discharge.        Left eye: No discharge.     Conjunctiva/sclera: Conjunctivae normal.  Cardiovascular:     Pulses:          Radial pulses are 2+ on the right side and 2+ on the left side.  Pulmonary:     Effort: Pulmonary effort is normal.  Musculoskeletal:     Comments: Tenderness palpation noted to plantar surface of left heel.  No deformity or crepitus.  No surrounding warmth, erythema.  No evidence of foreign body.  Plantar flexion and dorsiflexion intact.  Pain is elicited with foot in dorsiflexion and toes extended.  No tenderness palpation to dorsal aspect of foot.  No tenderness palpation of distal tibia.  No tenderness palpation of the right lower extremity.  Bilateral lower extremities are symmetric in appearance without any overlying warmth, erythema, edema.  No calf tenderness.  Skin:    General: Skin is warm and dry.     Capillary Refill: Capillary refill takes less than 2 seconds.     Comments: Good distal cap refill. LLE is not dusky in appearance or cool to touch.  Neurological:     Mental Status: She is alert.     Comments: Sensation intact along major nerve distributions of left foot  Psychiatric:        Speech: Speech normal.        Behavior: Behavior normal.       ED Treatments / Results  Labs (all labs ordered are listed, but only abnormal results are displayed) Labs Reviewed - No data to display  EKG None  Radiology Dg Foot Complete Left  Result Date: 01/30/2019 CLINICAL DATA:  LEFT heel pain for 2 months. No known injury. Initial encounter. EXAM: LEFT FOOT - COMPLETE 3+ VIEW COMPARISON:  None. FINDINGS:  No fracture, subluxation or dislocation. A small calcaneal spur is noted. Equivocal heel soft tissue swelling is noted. No radiopaque foreign body. IMPRESSION: Equivocal heel soft tissue swelling.  No acute bony abnormality. Small calcaneal spur. Electronically Signed   By: Harmon Pier M.D.   On: 01/30/2019 19:49    Procedures Procedures (including critical care time)  Medications Ordered in ED Medications - No data to display   Initial Impression / Assessment and Plan / ED Course  I have reviewed the triage vital signs and the nursing notes.  Pertinent labs & imaging results that were available during my care of the patient were reviewed by me and considered in my medical decision making (see chart for details).        58 year old female who presents for evaluation of left foot pain x2.5 months.  No preceding trauma, injury.  No warmth, erythema, edema.  No fevers.  No numbness/weakness. Patient is afebrile, non-toxic appearing, sitting comfortably on examination table. Vital signs reviewed and stable.  Patient is neurovascularly intact.  Exam, patient's tenderness palpation to the heel.  Pain is also elicited with dorsiflexion of foot.  No foreign body, signs of surrounding infectious etiology.  Concern for plantar fasciitis versus arthritic pain.  History/physical exam not concerning for cellulitis, foreign body, infectious process, acute arterial embolism, DVT, septic arthritis.  Will plan for x-ray evaluation.  X-ray reviewed.  There is a small calcaneal spur.  Otherwise unremarkable.  Discussed results with patient.  I discussed  with patient that calcaneal spur could be contributing to the pain but also's exam suspicious for plantar fasciitis.  Patient is currently on Prozac and therefore went to limit any other further anti-inflammatories.  We will plan to give her outpatient podiatry referral.  Encouraged at home supportive care measures. At this time, patient exhibits no emergent life-threatening condition that require further evaluation in ED or admission. Patient had ample opportunity for questions and discussion. All patient's questions were answered with full understanding. Strict return precautions discussed. Patient expresses understanding and agreement to plan.   Portions of this note were generated with Scientist, clinical (histocompatibility and immunogenetics). Dictation errors may occur despite best attempts at proofreading.   Final Clinical Impressions(s) / ED Diagnoses   Final diagnoses:  Foot pain, left    ED Discharge Orders    None       Rosana Hoes 01/30/19 2209    Charlynne Pander, MD 01/30/19 2325

## 2019-01-30 NOTE — ED Triage Notes (Signed)
Pt reports left heel pain since February. Denies injury. States it is geting worse and feels like "there's a sharp rock in my shoe".

## 2019-01-30 NOTE — ED Notes (Signed)
Patient transported to X-ray. Ambulatory with slight limp.

## 2019-03-15 ENCOUNTER — Ambulatory Visit: Payer: Medicaid Other | Admitting: Podiatry

## 2019-03-31 ENCOUNTER — Ambulatory Visit: Payer: Medicaid Other | Admitting: Podiatry

## 2019-05-21 ENCOUNTER — Other Ambulatory Visit: Payer: Self-pay | Admitting: *Deleted

## 2019-05-21 DIAGNOSIS — Z20822 Contact with and (suspected) exposure to covid-19: Secondary | ICD-10-CM

## 2019-05-23 LAB — NOVEL CORONAVIRUS, NAA: SARS-CoV-2, NAA: NOT DETECTED

## 2020-01-26 ENCOUNTER — Ambulatory Visit: Payer: Medicaid Other | Admitting: Podiatry

## 2020-02-11 ENCOUNTER — Ambulatory Visit: Payer: Medicaid Other | Admitting: Podiatry

## 2020-05-01 ENCOUNTER — Emergency Department
Admission: EM | Admit: 2020-05-01 | Discharge: 2020-05-01 | Discharge status: Absent without leave | Payer: Medicaid Other | Source: Home / Self Care

## 2020-05-01 ENCOUNTER — Other Ambulatory Visit: Payer: Self-pay

## 2020-06-21 IMAGING — CR LEFT FOOT - COMPLETE 3+ VIEW
3 series · 3 of 3 positions shown · non-contrast
Comparison: None.

CLINICAL DATA: LEFT heel pain for 2 months. No known injury.
Initial encounter.

EXAM:
LEFT FOOT - COMPLETE 3+ VIEW

[t foot ap left]
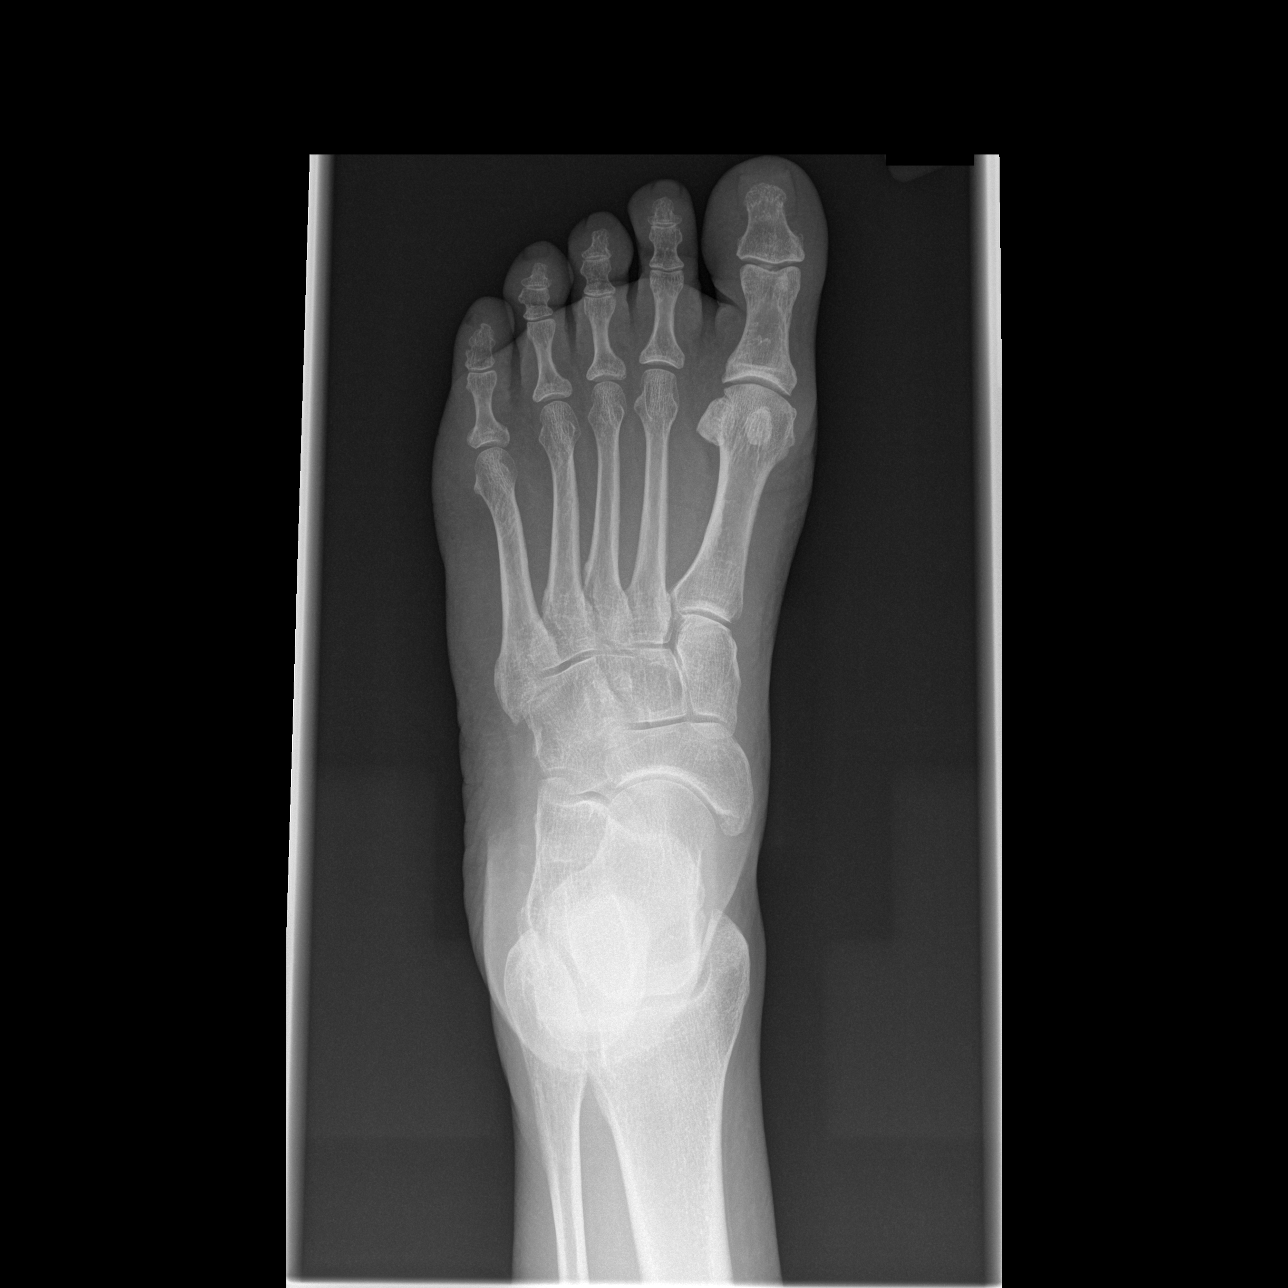

[t foot oblique left]
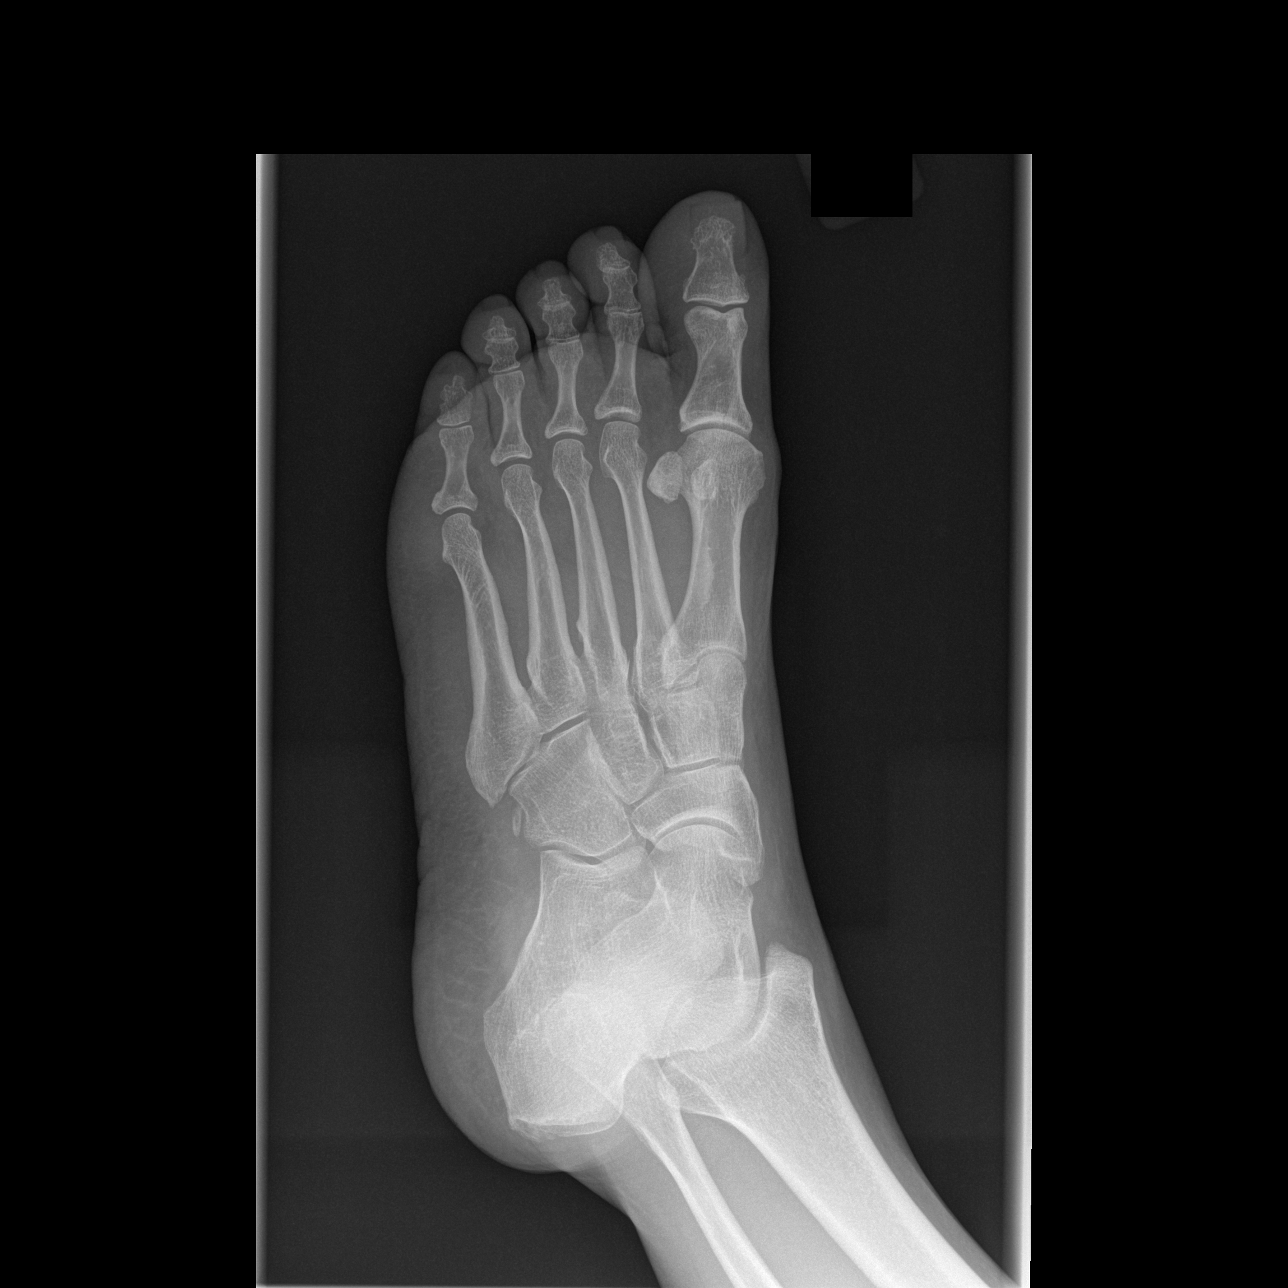

[t foot lat left]
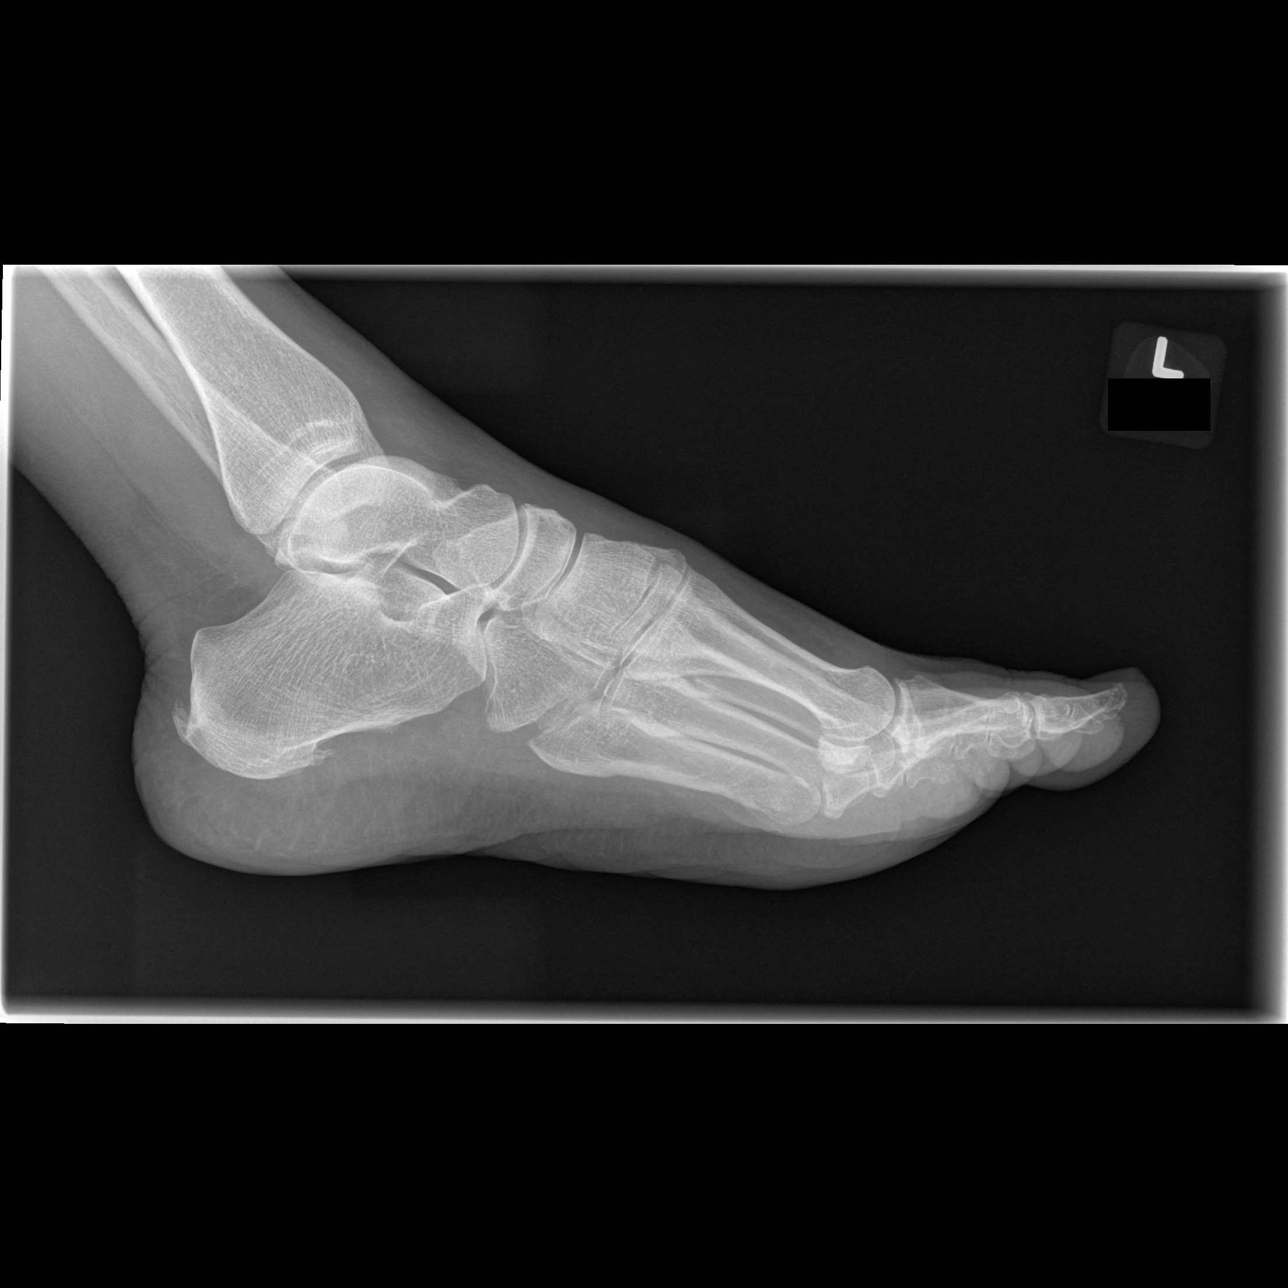

[3 of 3 positions shown; findings below may reference images not displayed]

FINDINGS: No fracture, subluxation or dislocation.

A small calcaneal spur is noted.

Equivocal heel soft tissue swelling is noted.

No radiopaque foreign body.
IMPRESSION: Equivocal heel soft tissue swelling.  No acute bony abnormality.

Small calcaneal spur.

## 2023-02-09 ENCOUNTER — Encounter: Payer: Self-pay | Admitting: Emergency Medicine

## 2023-02-09 ENCOUNTER — Ambulatory Visit (INDEPENDENT_AMBULATORY_CARE_PROVIDER_SITE_OTHER): Payer: Medicaid Other

## 2023-02-09 ENCOUNTER — Ambulatory Visit
Admission: EM | Admit: 2023-02-09 | Discharge: 2023-02-09 | Disposition: A | Discharge status: Home / Self Care | Payer: Medicaid Other | Attending: Family Medicine | Admitting: Family Medicine

## 2023-02-09 DIAGNOSIS — S59912A Unspecified injury of left forearm, initial encounter: Secondary | ICD-10-CM

## 2023-02-09 DIAGNOSIS — S52502A Unspecified fracture of the lower end of left radius, initial encounter for closed fracture: Secondary | ICD-10-CM

## 2023-02-09 MED ORDER — TRAMADOL HCL 50 MG PO TABS: 50.0000 mg | ORAL_TABLET | Freq: Four times a day (QID) | ORAL | 0 refills | Status: AC | PRN

## 2023-02-09 MED ORDER — ACETAMINOPHEN 500 MG PO TABS
1000.0000 mg | ORAL_TABLET | ORAL | Status: AC
  Administered 2023-02-09: 1000 mg via ORAL

## 2023-02-09 NOTE — ED Triage Notes (Signed)
Patient states that she was sitting on a swing and the swing broke.  She caught herself with her left arm/wrist.  Having pain from left wrist up to elbow.  Denies any OTC pain meds.

## 2023-02-09 NOTE — ED Provider Notes (Signed)
[facility]    CSN: [id number] Arrival date & time: 02/09/23  1337      History   Chief Complaint Chief Complaint  Patient presents with   Arm Injury    HPI Doris Padilla is a 62 y.o. Padilla.   HPI Doris 62 year old Padilla presents with left forearm injury that occurred roughly 2 hours ago.  Reports sitting on a swing when the swing broke attempting to catch herself with her left arm and wrist.  Now reports intense pain from left wrist to left elbow  Past Medical History:  Diagnosis Date   Anxiety    Arthritis    RA   Chronic pain    Depression    History of blood transfusion 2010   History of kidney stones    followed by cystocopy   Hypertension    Sjogren's disease (HCC)    Sleep apnea 2015   sleep study produced need for CPAP, insurance lapped, CPAP returned  but pt. want help in getting it back    Patient Active Problem List   Diagnosis Date Noted   Cervical spondylosis with radiculopathy 08/20/2017    Past Surgical History:  Procedure Laterality Date   ABDOMINAL HYSTERECTOMY     ANTERIOR CERVICAL DECOMP/DISCECTOMY FUSION N/A 08/20/2017   Procedure: ANTERIOR CERVICAL DECOMPRESSION/DISCECTOMY FUSION, INTERBODY PROSTHESIS, PLATE/SCREWS CERVICAL FIVE- CERVICAL SIX , CERVICAL SIX - CERVICAL SEVEN;  Surgeon: Tressie Stalker, MD;  Location: [facility] OR;  Service: Neurosurgery;  Laterality: N/A;   CESAREAN SECTION     x3   CHOLECYSTECTOMY      OB History   No obstetric history on file.      Home Medications    Prior to Admission medications   Medication Sig Start Date End Date Taking? Authorizing Provider  amLODipine (NORVASC) 10 MG tablet Take 10 mg by mouth daily.   Yes [provider]  aspirin EC 81 MG tablet Take 81 mg by mouth daily.   Yes [provider]  clonazePAM (KLONOPIN) 1 MG tablet Take 1 mg by mouth daily as needed for anxiety.    Yes [provider]  cycloSPORINE (RESTASIS) 0.05 % ophthalmic emulsion Place  1 drop into both eyes 2 (two) times daily.   Yes [provider]  docusate sodium (COLACE) 100 MG capsule Take 1 capsule (100 mg total) 2 (two) times daily by mouth. 08/21/17  Yes Tressie Stalker, MD  FLUoxetine (PROZAC) 40 MG capsule Take 40 mg by mouth daily before breakfast.    Yes [provider]  fluticasone (FLONASE) 50 MCG/ACT nasal spray SPRAY 1 SPRAY INTO EACH NOSTRIL EVERY DAYAS NEEDED FOR ALLERGIES 05/13/17  Yes [provider]  Multiple Vitamin (MULTIVITAMIN WITH MINERALS) TABS tablet Take 1 tablet by mouth daily.   Yes [provider]  orphenadrine (NORFLEX) 100 MG tablet TAKE 1 TABLET BY MOUTH 2 TIMES DAILY FOR 10 DAYS. 07/22/17  Yes [provider]  oxyCODONE 10 MG TABS Take 1 tablet (10 mg total) every 3 (three) hours as needed by mouth for severe pain ((score 7 to 10)). 08/21/17  Yes Tressie Stalker, MD  Oxycodone HCl 10 MG TABS TAKE 1 TABLET 3 TIMES A DAY AS NEEDED FOR PAIN 07/24/17  Yes [provider]  traMADol (ULTRAM) 50 MG tablet Take 1 tablet (50 mg total) by mouth every 6 (six) hours as needed. 02/09/23  Yes Trevor Iha, FNP    Family History History reviewed. No pertinent family history.  Social History Social History  Tobacco Use   Smoking status: Every Day    Packs/day: .5    Types: Cigarettes   Smokeless tobacco: Never  Vaping Use   Vaping Use: Some days  Substance Use Topics   Alcohol use: No   Drug use: No     Allergies   Other and Sudafed pe [phenylephrine]   Review of Systems Review of Systems  Musculoskeletal:        Left forearm injury that occurred 2 hours ago, patient reports falling out of swing and attempting to break her fall with her left forearm     Physical Exam Triage Vital Signs ED Triage Vitals  Enc Vitals Group     BP      Pulse      Resp      Temp      Temp src      SpO2      Weight      Height      Head Circumference      Peak Flow      Pain Score      Pain  Loc      Pain Edu?      Excl. in GC?    No data found.  Updated Vital Signs BP (!) 167/81 (BP Location: Right Arm)   Pulse 72   Temp (!) 97.4 F (36.3 C) (Oral)   Resp 18   Ht 5\' 1"  (1.549 m)   Wt 150 lb (68 kg)   SpO2 95%   BMI 28.34 kg/m      Physical Exam Vitals and nursing note reviewed.  Constitutional:      Appearance: Normal appearance. She is normal weight.  HENT:     Head: Normocephalic and atraumatic.     Mouth/Throat:     Mouth: Mucous membranes are moist.     Pharynx: Oropharynx is clear.  Eyes:     Extraocular Movements: Extraocular movements intact.     Conjunctiva/sclera: Conjunctivae normal.     Pupils: Pupils are equal, round, and reactive to light.  Cardiovascular:     Rate and Rhythm: Normal rate and regular rhythm.     Pulses: Normal pulses.     Heart sounds: Normal heart sounds.  Pulmonary:     Effort: Pulmonary effort is normal.     Breath sounds: Normal breath sounds. No wheezing, rhonchi or rales.  Musculoskeletal:        General: Normal range of motion.     Cervical back: Normal range of motion and neck supple.     Comments: Left forearm: Patient reporting intense pain exam limited today due to pain patient holding left forearm in guarded position on exam  Skin:    General: Skin is warm and dry.  Neurological:     General: No focal deficit present.     Mental Status: She is alert and oriented to person, place, and time. Mental status is at baseline.      UC Treatments / Results  Labs (all labs ordered are listed, but only abnormal results are displayed) Labs Reviewed - No data to display  EKG   Radiology DG Forearm Left  Result Date: 02/09/2023 CLINICAL DATA:  Larey Seat off a swing today, left forearm and elbow pain EXAM: LEFT FOREARM - 2 VIEW COMPARISON:  None Available. FINDINGS: Frontal and lateral views of the left forearm are obtained. There is a comminuted intra-articular distal left radial fracture, with minimal impaction at  the fracture site. Radiocarpal joint remains intact.  There are no other acute displaced fractures. Specifically, the left elbow appears unremarkable. If pain is referable to the left elbow, dedicated imaging may be useful. Soft tissue swelling overlies the left wrist. 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 IMPRESSION: 1. Comminuted intra-articular distal left radial fracture with mild impaction at the fracture site. 2. Otherwise unremarkable left forearm. If there is point tenderness at the left elbow, dedicated left elbow evaluation is recommended. 3. 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 Electronically Signed   By: Sharlet Salina M.D.   On: 02/09/2023 14:29    Procedures Procedures (including critical care time)  Medications Ordered in UC Medications  acetaminophen (TYLENOL) tablet 1,000 mg (1,000 mg Oral Given 02/09/23 1409)    Initial Impression / Assessment and Plan / UC Course  I have reviewed the triage vital signs and the nursing notes.  Pertinent labs & imaging results that were available during my care of the patient were reviewed by me and considered in my medical decision making (see chart for details).     1.  Unspecified fracture of the lower end of left radius, initial encounter for closed fracture-left forearm x-ray revealed above, Rx'd Tramadol 50 mg every 6 hours for left forearm pain.  Patient advised to hold previously prescribed Oxycodone if taking prescribed tramadol today. Advised patient of commuted intra-articular left distal radius fracture with hardcopy and images provided. Advised patient may take Tramadol daily or as needed for left forearm pain.  Advised patient not to take currently prescribed oxycodone if taking Tramadol.  Advised patient to follow-up at [facility] orthopedics at [facility] 12/26/2016 [facility] [address] [place], [place] [address] tomorrow morning, Monday, 429/24 for further evaluation and  fracture management.  Patient discharged home, hemodynamically stable. Final Clinical Impressions(s) / UC Diagnoses   Final diagnoses:  Injury of left forearm, initial encounter  Unspecified fracture of the lower end of left radius, initial encounter for closed fracture     Discharge Instructions      Advised patient of commuted intra-articular left distal radius fracture with hardcopy and images provided. Advised patient may take Tramadol daily or as needed for left forearm pain.  Advised patient not to take currently prescribed oxycodone if taking Tramadol.  Advised patient to follow-up at [facility] orthopedics at [facility] 12/26/2016 [facility] [address] [place], [place] [address] tomorrow morning, Monday, 429/24 for further evaluation and fracture management.     ED Prescriptions     Medication Sig Dispense Auth. Provider   traMADol (ULTRAM) 50 MG tablet Take 1 tablet (50 mg total) by mouth every 6 (six) hours as needed. 24 tablet Trevor Iha, FNP      I have reviewed the PDMP during this encounter.   Trevor Iha, FNP 02/09/23 [phone]

## 2023-02-09 NOTE — Discharge Instructions (Addendum)
Advised patient of commuted intra-articular left distal radius fracture with hardcopy and images provided. Advised patient may take Tramadol daily or as needed for left forearm pain.  Advised patient not to take currently prescribed oxycodone if taking Tramadol.  Advised patient to follow-up at [facility] orthopedics at [facility] 12/26/2016 [facility] [address] [place], [place] [address] tomorrow morning, Monday, 429/24 for further evaluation and fracture management.

## 2023-02-10 ENCOUNTER — Telehealth: Payer: Self-pay | Admitting: Orthopaedic Surgery

## 2023-02-10 NOTE — Telephone Encounter (Signed)
Pt called and was seen at [facility] urgent care for fractured left radius. I put her on Xu schedule but discharge states for her to be seen at [facility] location. Can you open slot for her to be seen by Dr Steward Drone today and cancel other appt. Please call pt if possible. Pt phone number is [phone].

## 2023-02-12 ENCOUNTER — Ambulatory Visit: Payer: Medicaid Other | Admitting: Orthopaedic Surgery

## 2023-02-12 ENCOUNTER — Other Ambulatory Visit (INDEPENDENT_AMBULATORY_CARE_PROVIDER_SITE_OTHER): Payer: Medicaid Other

## 2023-02-12 ENCOUNTER — Encounter: Payer: Self-pay | Admitting: Orthopaedic Surgery

## 2023-02-12 DIAGNOSIS — M79642 Pain in left hand: Secondary | ICD-10-CM

## 2023-02-12 DIAGNOSIS — S52502A Unspecified fracture of the lower end of left radius, initial encounter for closed fracture: Secondary | ICD-10-CM | POA: Diagnosis not present

## 2023-02-12 NOTE — Progress Notes (Signed)
Office Visit Note   Patient: Doris Padilla           Date of Birth: [birth year]           MRN: [id number] Visit Date: 02/12/2023              Requested by: No referring provider defined for this encounter. PCP: Pcp, No   Assessment & Plan: Visit Diagnoses:  1. Pain in left hand   2. Closed fracture of distal end of left radius, unspecified fracture morphology, initial encounter     Plan: Impression is left wrist distal radius fracture.  This should be amenable to nonoperative treatment.  Will allow her to continue wearing her removable short arm splint nonweightbearing.  She will follow-up with [place] in 1 week for repeat evaluation and repeat x-rays of the left wrist.  Call with concerns or questions in the meantime.  Follow-Up Instructions: Return in about 1 week (around 02/19/2023).   Orders:  Orders Placed This Encounter  Procedures   XR Hand Complete Left   No orders of the defined types were placed in this encounter.     Procedures: No procedures performed   Clinical Data: No additional findings.   Subjective: Chief Complaint  Patient presents with   Left Wrist - Injury    DOI 02/09/2023    HPI patient is a pleasant 62 year old right-hand-dominant female who comes in today following an injury to her left wrist.  On 02/09/2023, she was about 3 feet in the air on a swing when the rope broke causing her to fall to the ground landing on an outstretched left hand.  She was seen in [place] urgent [facility] where x-rays of the forearm were obtained.  X-rays demonstrated a left distal radius fracture.  She was placed in a removable Velcro wrist splint.  She is here today for follow-up.  She is having pain to the entire wrist and into the hand.  Worse with any movement of the wrist.  She is on oxycodone per pain management and has not had to double up on her dose.  She notes tingling to the hand as well.  Review of Systems as detailed in HPI.  All others reviewed and are  negative.   Objective: Vital Signs: There were no vitals taken for this visit.  Physical Exam well-developed well-nourished female no acute distress.  Alert and oriented x 3.  Ortho Exam left wrist exam reveals moderate swelling.  Diffuse tenderness throughout the distal radius and dorsum of the wrist.  She is able to move all fingers.  She is neurovascularly intact distally.  Specialty Comments:  No specialty comments available.  Imaging: XR Hand Complete Left  Result Date: 02/12/2023 X-rays demonstrate advanced degenerative changes to the first [facility] joint.  Distal radius fracture.    PMFS History: Patient Active Problem List   Diagnosis Date Noted   Cervical spondylosis with radiculopathy 08/20/2017   Past Medical History:  Diagnosis Date   Anxiety    Arthritis    RA   Chronic pain    Depression    History of blood transfusion 2010   History of kidney stones    followed by cystocopy   Hypertension    Sjogren's disease (HCC)    Sleep apnea 2015   sleep study produced need for CPAP, insurance lapped, CPAP returned  but pt. want help in getting it back    No family history on file.  Past Surgical History:  Procedure Laterality Date  ABDOMINAL HYSTERECTOMY     ANTERIOR CERVICAL DECOMP/DISCECTOMY FUSION N/A 08/20/2017   Procedure: ANTERIOR CERVICAL DECOMPRESSION/DISCECTOMY FUSION, INTERBODY PROSTHESIS, PLATE/SCREWS CERVICAL FIVE- CERVICAL SIX , CERVICAL SIX - CERVICAL SEVEN;  Surgeon: Tressie Stalker, MD;  Location: [facility] OR;  Service: Neurosurgery;  Laterality: N/A;   CESAREAN SECTION     x3   CHOLECYSTECTOMY     Social History   Occupational History   Not on file  Tobacco Use   Smoking status: Every Day    Packs/day: .5    Types: Cigarettes   Smokeless tobacco: Never  Vaping Use   Vaping Use: Some days  Substance and Sexual Activity   Alcohol use: No   Drug use: No   Sexual activity: Not Currently

## 2023-02-19 ENCOUNTER — Ambulatory Visit: Payer: Medicaid Other | Admitting: Orthopaedic Surgery

## 2023-02-26 ENCOUNTER — Ambulatory Visit: Payer: Medicaid Other | Admitting: Orthopaedic Surgery

## 2023-02-28 ENCOUNTER — Ambulatory Visit: Payer: Medicaid Other | Admitting: Orthopaedic Surgery

## 2023-08-21 ENCOUNTER — Ambulatory Visit (INDEPENDENT_AMBULATORY_CARE_PROVIDER_SITE_OTHER): Payer: Medicaid Other | Admitting: Podiatry

## 2023-08-21 ENCOUNTER — Ambulatory Visit: Payer: Medicaid Other

## 2023-08-21 ENCOUNTER — Other Ambulatory Visit: Payer: Self-pay | Admitting: Podiatry

## 2023-08-21 ENCOUNTER — Encounter: Payer: Self-pay | Admitting: Podiatry

## 2023-08-21 DIAGNOSIS — M79671 Pain in right foot: Secondary | ICD-10-CM

## 2023-08-21 DIAGNOSIS — L603 Nail dystrophy: Secondary | ICD-10-CM

## 2023-08-21 DIAGNOSIS — M7731 Calcaneal spur, right foot: Secondary | ICD-10-CM | POA: Diagnosis not present

## 2023-08-21 DIAGNOSIS — M7661 Achilles tendinitis, right leg: Secondary | ICD-10-CM | POA: Diagnosis not present

## 2023-08-21 DIAGNOSIS — L600 Ingrowing nail: Secondary | ICD-10-CM

## 2023-08-21 MED ORDER — MELOXICAM 15 MG PO TABS: 15.0000 mg | ORAL_TABLET | Freq: Every day | ORAL | 0 refills | Status: AC

## 2023-08-21 NOTE — Progress Notes (Signed)
Subjective:  Patient ID: Doris Padilla, female    DOB: Mar 18, 1961,   MRN: [id number]  No chief complaint on file.   62 y.o. female presents for concern of right foot pain that has been ongoing for about 6 months. Relates pain in the back of her heel. Relates it does come and go and seems to be worse with more activity. Relates certain shoes have been painful. Has taken some tyelnol with some relief. Relates she has had the nail removed on her great toe on the right but grew back and requesting to have it removed again.  . Denies any other pedal complaints. Denies n/v/f/c.   Past Medical History:  Diagnosis Date   Anxiety    Arthritis    RA   Chronic pain    Depression    History of blood transfusion 2010   History of kidney stones    followed by cystocopy   Hypertension    Sjogren's disease (HCC)    Sleep apnea 2015   sleep study produced need for CPAP, insurance lapped, CPAP returned  but pt. want help in getting it back    Objective:  Physical Exam: Vascular: DP/PT pulses 2/4 bilateral. CFT <3 seconds. Normal hair growth on digits. No edema.  Skin. No lacerations or abrasions bilateral feet. Right hallux nail thickened dystrophic and painful to palpation.  Musculoskeletal: MMT 5/5 bilateral lower extremities in DF, PF, Inversion and Eversion. Deceased ROM in DF of ankle joint. Tender to the insertion of the achilles on the rigth. Some pain with DF and PF at the heel. No pain to palpation elsewhere.  Neurological: Sensation intact to light touch.   Assessment:   1. Tendonitis, Achilles, right   2. Onychodystrophy   3. Ingrown right greater toenail      Plan:  Patient was evaluated and treated and all questions answered. Discussed plantar fasciitis with patient.  X-rays reviewed and discussed with patient. No acute fractures or dislocations noted. Spurring noted at inferior and posterior calcaneus as well as some joint space narrowing and degenerative changes noted at first  MPJ.  -Discussed Achilles insertional tendonitis and treatment options with patient.  -Discussed stretching exercises. -Rx Meloxicam provided. Most recent CMP wnl.  -Heel lifts provided and discussed proper shoewear.  -Discussed if no improvement will consider MRI/PT/EPAT/PRP injections.  Discussed ingrown toenails etiology and treatment options including procedure for removal vs conservative care.  Patient requesting removal of ingrown nail today. Procedure below.  Discussed procedure and post procedure care and patient expressed understanding.  Will follow-up in 2 weeks for nail check or sooner if any problems arise.    Procedure:  Procedure: partial Nail Avulsion of right hallux nail Surgeon: Louann Sjogren, DPM  Pre-op Dx: Ingrown toenail without infection Post-op: Same  Place of Surgery: Office exam room.  Indications for surgery: Painful and ingrown toenail.    The patient is requesting removal of nail with  chemical matrixectomy. Risks and complications were discussed with the patient for which they understand and written consent was obtained. Under sterile conditions a total of 3 mL of  1% lidocaine plain was infiltrated in a hallux block fashion. Once anesthetized, the skin was prepped in sterile fashion. A tourniquet was then applied. Next the entire hallux nail was removed.  Next phenol was then applied under standard conditions to permanently destroy the matrix and copiously irrigated. Silvadene was applied. A dry sterile dressing was applied. After application of the dressing the tourniquet was removed and there is found  to be an immediate capillary refill time to the digit. The patient tolerated the procedure well without any complications. Post procedure instructions were discussed the patient for which he verbally understood. Follow-up in two weeks for nail check or sooner if any problems are to arise. Discussed signs/symptoms of infection and directed to call the office  immediately should any occur or go directly to the emergency room. In the meantime, encouraged to call the office with any questions, concerns, changes symptoms.    Louann Sjogren, DPM

## 2023-08-21 NOTE — Patient Instructions (Signed)
Soak Instructions    THE DAY AFTER THE PROCEDURE  Place 1/4 cup of epsom salts (or betadine, or white vinegar) in a quart of warm tap water.  Submerge your foot or feet with outer bandage intact for the initial soak; this will allow the bandage to become moist and wet for easy lift off.  Once you remove your bandage, continue to soak in the solution for 20 minutes.  This soak should be done twice a day.  Next, remove your foot or feet from solution, blot dry the affected area and cover.  You may use a band aid large enough to cover the area or use gauze and tape.  Apply other medications to the area as directed by the doctor such as polysporin neosporin.  IF YOUR SKIN BECOMES IRRITATED WHILE USING THESE INSTRUCTIONS, IT IS OKAY TO SWITCH TO  WHITE VINEGAR AND WATER. Or you may use antibacterial soap and water to keep the toe clean  Monitor for any signs/symptoms of infection. Call the office immediately if any occur or go directly to the emergency room. Call with any questions/concerns.    [facility] Instructions-Post Nail Surgery  You have had your ingrown toenail and root treated with a chemical.  This chemical causes a burn that will drain and ooze like a blister.  This can drain for 6-8 weeks or longer.  It is important to keep this area clean, covered, and follow the soaking instructions dispensed at the time of your surgery.  This area will eventually dry and form a scab.  Once the scab forms you no longer need to soak or apply a dressing.  If at any time you experience an increase in pain, redness, swelling, or drainage, you should contact the office as soon as possible.   Achilles Tendinitis  with Rehab Achilles tendinitis is a disorder of the Achilles tendon. The Achilles tendon connects the large calf muscles (Gastrocnemius and Soleus) to the heel bone (calcaneus). This tendon is sometimes called the heel cord. It is important for pushing-off and standing on your toes and is  important for walking, running, or jumping. Tendinitis is often caused by overuse and repetitive microtrauma. SYMPTOMS Pain, tenderness, swelling, warmth, and redness may occur over the Achilles tendon even at rest. Pain with pushing off, or flexing or extending the ankle. Pain that is worsened after or during activity. CAUSES  Overuse sometimes seen with rapid increase in exercise programs or in sports requiring running and jumping. Poor physical conditioning (strength and flexibility or endurance). Running sports, especially training running down hills. Inadequate warm-up before practice or play or failure to stretch before participation. Injury to the tendon. PREVENTION  Warm up and stretch before practice or competition. Allow time for adequate rest and recovery between practices and competition. Keep up conditioning. Keep up ankle and leg flexibility. Improve or keep muscle strength and endurance. Improve cardiovascular fitness. Use proper technique. Use proper equipment (shoes, skates). To help prevent recurrence, taping, protective strapping, or an adhesive bandage may be recommended for several weeks after healing is complete. PROGNOSIS  Recovery may take weeks to several months to heal. Longer recovery is expected if symptoms have been prolonged. Recovery is usually quicker if the inflammation is due to a direct blow as compared with overuse or sudden strain. RELATED COMPLICATIONS  Healing time will be prolonged if the condition is not correctly treated. The injury must be given plenty of time to heal. Symptoms can reoccur if activity is resumed too soon.  Untreated, tendinitis may increase the risk of tendon rupture requiring additional time for recovery and possibly surgery. TREATMENT  The first treatment consists of rest anti-inflammatory medication, and ice to relieve the pain. Stretching and strengthening exercises after resolution of pain will likely help reduce the risk  of recurrence. Referral to a physical therapist or athletic trainer for further evaluation and treatment may be helpful. A walking boot or cast may be recommended to rest the Achilles tendon. This can help break the cycle of inflammation and microtrauma. Arch supports (orthotics) may be prescribed or recommended by your caregiver as an adjunct to therapy and rest. Surgery to remove the inflamed tendon lining or degenerated tendon tissue is rarely necessary and has shown less than predictable results. MEDICATION  Nonsteroidal anti-inflammatory medications, such as aspirin and ibuprofen, may be used for pain and inflammation relief. Do not take within 7 days before surgery. Take these as directed by your caregiver. Contact your caregiver immediately if any bleeding, stomach upset, or signs of allergic reaction occur. Other minor pain relievers, such as acetaminophen, may also be used. Pain relievers may be prescribed as necessary by your caregiver. Do not take prescription pain medication for longer than 4 to 7 days. Use only as directed and only as much as you need. Cortisone injections are rarely indicated. Cortisone injections may weaken tendons and predispose to rupture. It is better to give the condition more time to heal than to use them. HEAT AND COLD Cold is used to relieve pain and reduce inflammation for acute and chronic Achilles tendinitis. Cold should be applied for 10 to 15 minutes every 2 to 3 hours for inflammation and pain and immediately after any activity that aggravates your symptoms. Use ice packs or an ice massage. Heat may be used before performing stretching and strengthening activities prescribed by your caregiver. Use a heat pack or a warm soak. SEEK MEDICAL CARE IF: Symptoms get worse or do not improve in 2 weeks despite treatment. New, unexplained symptoms develop. Drugs used in treatment may produce side effects.  EXERCISES:  RANGE OF MOTION (ROM) AND STRETCHING EXERCISES  - Achilles Tendinitis  These exercises may help you when beginning to rehabilitate your injury. Your symptoms may resolve with or without further involvement from your physician, physical therapist or athletic trainer. While completing these exercises, remember:  Restoring tissue flexibility helps normal motion to return to the joints. This allows healthier, less painful movement and activity. An effective stretch should be held for at least 30 seconds. A stretch should never be painful. You should only feel a gentle lengthening or release in the stretched tissue.  STRETCH  Gastroc, Standing  Place hands on wall. Extend right / left leg, keeping the front knee somewhat bent. Slightly point your toes inward on your back foot. Keeping your right / left heel on the floor and your knee straight, shift your weight toward the wall, not allowing your back to arch. You should feel a gentle stretch in the right / left calf. Hold this position for 10 seconds. Repeat 3 times. Complete this stretch 2 times per day.  STRETCH  Soleus, Standing  Place hands on wall. Extend right / left leg, keeping the other knee somewhat bent. Slightly point your toes inward on your back foot. Keep your right / left heel on the floor, bend your back knee, and slightly shift your weight over the back leg so that you feel a gentle stretch deep in your back calf. Hold this position  for 10 seconds. Repeat 3 times. Complete this stretch 2 times per day.  STRETCH  Gastrocsoleus, Standing  Note: This exercise can place a lot of stress on your foot and ankle. Please complete this exercise only if specifically instructed by your caregiver.  Place the ball of your right / left foot on a step, keeping your other foot firmly on the same step. Hold on to the wall or a rail for balance. Slowly lift your other foot, allowing your body weight to press your heel down over the edge of the step. You should feel a stretch in your right /  left calf. Hold this position for 10 seconds. Repeat this exercise with a slight bend in your knee. Repeat 3 times. Complete this stretch 2 times per day.   STRENGTHENING EXERCISES - Achilles Tendinitis These exercises may help you when beginning to rehabilitate your injury. They may resolve your symptoms with or without further involvement from your physician, physical therapist or athletic trainer. While completing these exercises, remember:  Muscles can gain both the endurance and the strength needed for everyday activities through controlled exercises. Complete these exercises as instructed by your physician, physical therapist or athletic trainer. Progress the resistance and repetitions only as guided. You may experience muscle soreness or fatigue, but the pain or discomfort you are trying to eliminate should never worsen during these exercises. If this pain does worsen, stop and make certain you are following the directions exactly. If the pain is still present after adjustments, discontinue the exercise until you can discuss the trouble with your clinician.  STRENGTH - Plantar-flexors  Sit with your right / left leg extended. Holding onto both ends of a rubber exercise band/tubing, loop it around the ball of your foot. Keep a slight tension in the band. Slowly push your toes away from you, pointing them downward. Hold this position for 10 seconds. Return slowly, controlling the tension in the band/tubing. Repeat 3 times. Complete this exercise 2 times per day.   STRENGTH - Plantar-flexors  Stand with your feet shoulder width apart. Steady yourself with a wall or table using as little support as needed. Keeping your weight evenly spread over the width of your feet, rise up on your toes.* Hold this position for 10 seconds. Repeat 3 times. Complete this exercise 2 times per day.  *If this is too easy, shift your weight toward your right / left leg until you feel challenged. Ultimately, you  may be asked to do this exercise with your right / left foot only.  STRENGTH  Plantar-flexors, Eccentric  Note: This exercise can place a lot of stress on your foot and ankle. Please complete this exercise only if specifically instructed by your caregiver.  Place the balls of your feet on a step. With your hands, use only enough support from a wall or rail to keep your balance. Keep your knees straight and rise up on your toes. Slowly shift your weight entirely to your right / left toes and pick up your opposite foot. Gently and with controlled movement, lower your weight through your right / left foot so that your heel drops below the level of the step. You will feel a slight stretch in the back of your calf at the end position. Use the healthy leg to help rise up onto the balls of both feet, then lower weight only on the right / left leg again. Build up to 15 repetitions. Then progress to 3 consecutive sets of 15 repetitions.*  After completing the above exercise, complete the same exercise with a slight knee bend (about 30 degrees). Again, build up to 15 repetitions. Then progress to 3 consecutive sets of 15 repetitions.* Perform this exercise 2 times per day.  *When you easily complete 3 sets of 15, your physician, physical therapist or athletic trainer may advise you to add resistance by wearing a backpack filled with additional weight.  STRENGTH - Plantar Flexors, Seated  Sit on a chair that allows your feet to rest flat on the ground. If necessary, sit at the edge of the chair. Keeping your toes firmly on the ground, lift your right / left heel as far as you can without increasing any discomfort in your ankle. Repeat 3 times. Complete this exercise 2 times a day.

## 2023-09-05 ENCOUNTER — Ambulatory Visit (INDEPENDENT_AMBULATORY_CARE_PROVIDER_SITE_OTHER): Payer: Medicaid Other | Admitting: Podiatry

## 2023-09-05 DIAGNOSIS — Z91199 Patient's noncompliance with other medical treatment and regimen due to unspecified reason: Secondary | ICD-10-CM

## 2023-09-05 NOTE — Progress Notes (Signed)
No show
